# Patient Record
Sex: Male | Born: 1945 | Race: White | Hispanic: No | Marital: Married | Smoking: Former smoker
Health system: Southern US, Community
[De-identification: ages and names within clinical notes are randomized; demographics above are authoritative.]

## PROBLEM LIST (undated history)

## (undated) DIAGNOSIS — M545 Low back pain, unspecified: Secondary | ICD-10-CM

## (undated) DIAGNOSIS — E119 Type 2 diabetes mellitus without complications: Secondary | ICD-10-CM

## (undated) DIAGNOSIS — D3000 Benign neoplasm of unspecified kidney: Secondary | ICD-10-CM

## (undated) DIAGNOSIS — K449 Diaphragmatic hernia without obstruction or gangrene: Secondary | ICD-10-CM

## (undated) DIAGNOSIS — E785 Hyperlipidemia, unspecified: Secondary | ICD-10-CM

## (undated) DIAGNOSIS — K439 Ventral hernia without obstruction or gangrene: Secondary | ICD-10-CM

## (undated) DIAGNOSIS — I700 Atherosclerosis of aorta: Secondary | ICD-10-CM

## (undated) DIAGNOSIS — N529 Male erectile dysfunction, unspecified: Secondary | ICD-10-CM

## (undated) DIAGNOSIS — J449 Chronic obstructive pulmonary disease, unspecified: Secondary | ICD-10-CM

## (undated) DIAGNOSIS — I10 Essential (primary) hypertension: Secondary | ICD-10-CM

## (undated) DIAGNOSIS — N183 Chronic kidney disease, stage 3 unspecified: Secondary | ICD-10-CM

## (undated) DIAGNOSIS — M1990 Unspecified osteoarthritis, unspecified site: Secondary | ICD-10-CM

## (undated) HISTORY — DX: Type 2 diabetes mellitus without complications: E11.9

## (undated) HISTORY — DX: Chronic obstructive pulmonary disease, unspecified: J44.9

## (undated) HISTORY — DX: Atherosclerosis of aorta: I70.0

## (undated) HISTORY — PX: TONSILLECTOMY: SUR1361

## (undated) HISTORY — DX: Diaphragmatic hernia without obstruction or gangrene: K44.9

## (undated) HISTORY — PX: BACK SURGERY: SHX140

## (undated) HISTORY — DX: Chronic kidney disease, stage 3 unspecified: N18.30

## (undated) HISTORY — DX: Male erectile dysfunction, unspecified: N52.9

## (undated) HISTORY — DX: Low back pain, unspecified: M54.50

## (undated) HISTORY — DX: Hyperlipidemia, unspecified: E78.5

## (undated) HISTORY — DX: Essential (primary) hypertension: I10

## (undated) HISTORY — PX: REPLACEMENT TOTAL KNEE: SUR1224

## (undated) HISTORY — PX: MUSCLE REPAIR: SHX867

---
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
## 1972-12-10 HISTORY — PX: INGUINAL HERNIA REPAIR: SHX194

## 1999-05-03 ENCOUNTER — Encounter: Payer: Self-pay | Admitting: Neurosurgery

## 1999-05-03 ENCOUNTER — Ambulatory Visit (HOSPITAL_COMMUNITY): Admission: RE | Admit: 1999-05-03 | Discharge: 1999-05-03 | Payer: Self-pay | Admitting: Neurosurgery

## 2008-01-19 ENCOUNTER — Encounter: Admission: RE | Admit: 2008-01-19 | Discharge: 2008-01-19 | Payer: Self-pay | Admitting: Internal Medicine

## 2008-02-18 ENCOUNTER — Ambulatory Visit (HOSPITAL_BASED_OUTPATIENT_CLINIC_OR_DEPARTMENT_OTHER): Admission: RE | Admit: 2008-02-18 | Discharge: 2008-02-18 | Payer: Self-pay | Admitting: Orthopedic Surgery

## 2008-10-02 ENCOUNTER — Encounter: Admission: RE | Admit: 2008-10-02 | Discharge: 2008-10-02 | Payer: Self-pay | Admitting: Internal Medicine

## 2008-11-16 ENCOUNTER — Encounter: Admission: RE | Admit: 2008-11-16 | Discharge: 2008-11-16 | Payer: Self-pay | Admitting: Neurosurgery

## 2008-12-01 ENCOUNTER — Encounter: Admission: RE | Admit: 2008-12-01 | Discharge: 2008-12-01 | Payer: Self-pay | Admitting: Neurosurgery

## 2009-03-25 ENCOUNTER — Encounter: Admission: RE | Admit: 2009-03-25 | Discharge: 2009-03-25 | Payer: Self-pay | Admitting: Internal Medicine

## 2009-05-03 ENCOUNTER — Encounter: Admission: RE | Admit: 2009-05-03 | Discharge: 2009-05-03 | Payer: Self-pay | Admitting: Neurosurgery

## 2009-12-05 ENCOUNTER — Inpatient Hospital Stay (HOSPITAL_COMMUNITY): Admission: RE | Admit: 2009-12-05 | Discharge: 2009-12-07 | Payer: Self-pay | Admitting: Orthopedic Surgery

## 2011-03-12 LAB — CBC
HCT: 33.7 % — ABNORMAL LOW (ref 39.0–52.0)
Hemoglobin: 11.4 g/dL — ABNORMAL LOW (ref 13.0–17.0)
Hemoglobin: 13.9 g/dL (ref 13.0–17.0)
MCHC: 33.9 g/dL (ref 30.0–36.0)
MCHC: 34 g/dL (ref 30.0–36.0)
MCV: 91.5 fL (ref 78.0–100.0)
MCV: 91.9 fL (ref 78.0–100.0)
Platelets: 204 10*3/uL (ref 150–400)
RBC: 3.68 MIL/uL — ABNORMAL LOW (ref 4.22–5.81)
RBC: 3.79 MIL/uL — ABNORMAL LOW (ref 4.22–5.81)
RBC: 4.51 MIL/uL (ref 4.22–5.81)
RDW: 13.3 % (ref 11.5–15.5)
WBC: 10.2 10*3/uL (ref 4.0–10.5)
WBC: 8.3 10*3/uL (ref 4.0–10.5)

## 2011-03-12 LAB — DIFFERENTIAL
Lymphocytes Relative: 22 % (ref 12–46)
Lymphs Abs: 1.8 10*3/uL (ref 0.7–4.0)
Monocytes Absolute: 0.5 10*3/uL (ref 0.1–1.0)
Monocytes Relative: 6 % (ref 3–12)
Neutro Abs: 5.8 10*3/uL (ref 1.7–7.7)

## 2011-03-12 LAB — TYPE AND SCREEN: Antibody Screen: NEGATIVE

## 2011-03-12 LAB — APTT: aPTT: 27 seconds (ref 24–37)

## 2011-03-12 LAB — BASIC METABOLIC PANEL
CO2: 30 mEq/L (ref 19–32)
Calcium: 9 mg/dL (ref 8.4–10.5)
Chloride: 102 mEq/L (ref 96–112)
Creatinine, Ser: 1.13 mg/dL (ref 0.4–1.5)
GFR calc Af Amer: >60 mL/min (ref >60)
GFR calc Af Amer: >60 mL/min (ref >60)
GFR calc non Af Amer: >60 mL/min (ref >60)
GFR calc non Af Amer: >60 mL/min (ref >60)
Potassium: 4.5 mEq/L (ref 3.5–5.1)
Sodium: 135 mEq/L (ref 135–145)

## 2011-03-12 LAB — GLUCOSE, CAPILLARY
Glucose-Capillary: 152 mg/dL — ABNORMAL HIGH (ref 70–99)
Glucose-Capillary: 153 mg/dL — ABNORMAL HIGH (ref 70–99)
Glucose-Capillary: 164 mg/dL — ABNORMAL HIGH (ref 70–99)
Glucose-Capillary: 168 mg/dL — ABNORMAL HIGH (ref 70–99)
Glucose-Capillary: 188 mg/dL — ABNORMAL HIGH (ref 70–99)
Glucose-Capillary: 190 mg/dL — ABNORMAL HIGH (ref 70–99)

## 2011-03-12 LAB — URINALYSIS, ROUTINE W REFLEX MICROSCOPIC
Nitrite: NEGATIVE
Specific Gravity, Urine: 1.018 (ref 1.005–1.030)
Urobilinogen, UA: 0.2 mg/dL (ref 0.0–1.0)
pH: 7 (ref 5.0–8.0)

## 2011-03-12 LAB — PROTIME-INR: Prothrombin Time: 13.9 seconds (ref 11.6–15.2)

## 2011-04-24 NOTE — Op Note (Signed)
NAME:  ADRIK, PANNO NO.:  [id number]   MEDICAL RECORD NO.:  [id number]          PATIENT TYPE:  AMB   LOCATION:  [facility]                         FACILITY:  [facility]   PHYSICIAN:  Gaynelle Arabian, M.D.    DATE OF BIRTH:  15-Jan-1946   DATE OF PROCEDURE:  02/18/2008  DATE OF DISCHARGE:                               OPERATIVE REPORT   PREOPERATIVE DIAGNOSIS:  Left knee medial meniscal tear.   POSTOPERATIVE DIAGNOSIS:  Left knee medial meniscal tear.   PROCEDURE:  Left knee arthroscopy with meniscal debridement.   SURGEON:  Gaynelle Arabian, M.D.   ASSISTANT:  None.   ANESTHESIA:  General.   ESTIMATED BLOOD LOSS:  Minimal.   DRAINS:  None.   COMPLICATIONS:  Discharged to recovery.   CLINICAL NOTE:  Rick [name] is a 65 year old male with several month  history of significant knee pain and mechanical symptoms. It has gotten  progressively worse over time.  Exam and history suggestive of medial  meniscal tear confirmed by MRI.  He presents now for arthroscopy and  debridement.   PROCEDURE IN DETAIL:  After successful administration of general  anesthetic, a tourniquet was placed high on the left thigh and left  lower extremity prepped and draped in the usual sterile fashion.  Standard superomedial and inferolateral incisions were made.  Inflow  cannula passed superomedial and  camera passed inferolateral.  Arthroscope visualization proceeds.  Undersurface of patella has mild  chondromalacia but no unstable cartilage.  The trochlea looks fairly  normal.  Medial and lateral gutters are visualized.  There are no loose  bodies.  Flexion and valgus force is applied to the knee and the medial  compartment is entered. Upon entering, it is noted that there is a  significant degenerative tear in the body and posterior horn of the  medial meniscus which appears unstable.  There is minimal chondromalacia  in the medial compartment.  A spinal needle was used, 05 standard, for  medial portals.  Small incision made and dilator placed.  We then  debrided the meniscus back to a stable base with baskets and a 4.2 mm  shaver and then sealed it off with the ArthroCare device.  The femoral  condyle looked normal.  The intercondylar notch is then visualized.  The  ACL looks normal.  Lateral compartments entered and it is normal.  I  again inspected the joint and no further tears, loose bodies, or  chondral defects.  The arthroscopic instruments were  removed from the inferior portals which I closed with interrupted 4-0  nylon.  Then 20 mL of 0.25% Marcaine with epi injected through the  inflow cannula, then that is removed and that portal closed with nylon.  A bulky sterile dressing was applied and he was awakened and transported  to recovery in stable condition.      Gaynelle Arabian, M.D.  Electronically Signed     FA/MEDQ  D:  02/18/2008  T:  02/18/2008  Job:  [id number]

## 2011-09-03 LAB — POCT I-STAT, CHEM 8
BUN: 17
BUN: 19
Chloride: 105
Chloride: 107
Creatinine, Ser: 0.9
Hemoglobin: 14.6
Potassium: 4.4
Potassium: 7.2
Sodium: 135
Sodium: 139

## 2013-03-06 ENCOUNTER — Other Ambulatory Visit: Payer: Self-pay | Admitting: Internal Medicine

## 2013-03-06 DIAGNOSIS — Z905 Acquired absence of kidney: Secondary | ICD-10-CM

## 2013-03-23 ENCOUNTER — Ambulatory Visit
Admission: RE | Admit: 2013-03-23 | Discharge: 2013-03-23 | Disposition: A | Discharge status: Home / Self Care | Payer: BC Managed Care – PPO | Source: Ambulatory Visit | Attending: Internal Medicine | Admitting: Internal Medicine

## 2013-03-23 ENCOUNTER — Other Ambulatory Visit: Payer: Self-pay | Admitting: Internal Medicine

## 2013-03-23 DIAGNOSIS — Z905 Acquired absence of kidney: Secondary | ICD-10-CM

## 2013-03-23 MED ORDER — IOHEXOL 300 MG/ML  SOLN
125.0000 mL | Freq: Once | INTRAMUSCULAR | Status: AC | PRN
  Administered 2013-03-23: 125 mL via INTRAVENOUS

## 2013-03-23 MED ORDER — IOHEXOL 300 MG/ML  SOLN
30.0000 mL | Freq: Once | INTRAMUSCULAR | Status: AC | PRN
  Administered 2013-03-23: 30 mL via ORAL

## 2014-03-23 ENCOUNTER — Other Ambulatory Visit: Payer: Self-pay | Admitting: Neurosurgery

## 2014-03-23 DIAGNOSIS — M5136 Other intervertebral disc degeneration, lumbar region: Secondary | ICD-10-CM

## 2014-03-23 DIAGNOSIS — M51369 Other intervertebral disc degeneration, lumbar region without mention of lumbar back pain or lower extremity pain: Secondary | ICD-10-CM

## 2014-03-25 ENCOUNTER — Ambulatory Visit
Admission: RE | Admit: 2014-03-25 | Discharge: 2014-03-25 | Disposition: A | Discharge status: Home / Self Care | Payer: Self-pay | Source: Ambulatory Visit | Attending: Neurosurgery | Admitting: Neurosurgery

## 2014-03-25 VITALS — BP 142/68 | HR 73

## 2014-03-25 DIAGNOSIS — M51369 Other intervertebral disc degeneration, lumbar region without mention of lumbar back pain or lower extremity pain: Secondary | ICD-10-CM

## 2014-03-25 DIAGNOSIS — M5136 Other intervertebral disc degeneration, lumbar region: Secondary | ICD-10-CM

## 2014-03-25 MED ORDER — IOHEXOL 180 MG/ML  SOLN
15.0000 mL | Freq: Once | INTRAMUSCULAR | Status: AC | PRN
  Administered 2014-03-25: 17 mL via INTRATHECAL

## 2014-03-25 MED ORDER — DIAZEPAM 5 MG PO TABS
5.0000 mg | ORAL_TABLET | Freq: Once | ORAL | Status: AC
  Administered 2014-03-25: 5 mg via ORAL

## 2014-03-25 MED ORDER — ONDANSETRON HCL 4 MG/2ML IJ SOLN
4.0000 mg | Freq: Once | INTRAMUSCULAR | Status: AC
Start: 2014-03-25 — End: 2014-03-25
  Administered 2014-03-25: 4 mg via INTRAMUSCULAR

## 2014-03-25 MED ORDER — MEPERIDINE HCL 100 MG/ML IJ SOLN
100.0000 mg | Freq: Once | INTRAMUSCULAR | Status: AC
  Administered 2014-03-25: 100 mg via INTRAMUSCULAR

## 2014-03-25 NOTE — Discharge Instructions (Signed)

## 2014-04-02 ENCOUNTER — Other Ambulatory Visit: Payer: Self-pay | Admitting: Neurosurgery

## 2014-04-02 DIAGNOSIS — M5136 Other intervertebral disc degeneration, lumbar region: Secondary | ICD-10-CM

## 2014-04-13 ENCOUNTER — Other Ambulatory Visit: Payer: Self-pay | Admitting: Neurosurgery

## 2014-04-22 ENCOUNTER — Ambulatory Visit
Admission: RE | Admit: 2014-04-22 | Discharge: 2014-04-22 | Disposition: A | Discharge status: Home / Self Care | Payer: Self-pay | Source: Ambulatory Visit | Attending: Neurosurgery | Admitting: Neurosurgery

## 2014-04-22 DIAGNOSIS — M5136 Other intervertebral disc degeneration, lumbar region: Secondary | ICD-10-CM

## 2014-04-22 MED ORDER — METHYLPREDNISOLONE ACETATE 40 MG/ML INJ SUSP (RADIOLOG
120.0000 mg | Freq: Once | INTRAMUSCULAR | Status: AC
  Administered 2014-04-22: 120 mg via EPIDURAL

## 2014-04-22 MED ORDER — IOHEXOL 180 MG/ML  SOLN
1.0000 mL | Freq: Once | INTRAMUSCULAR | Status: AC | PRN
  Administered 2014-04-22: 1 mL via EPIDURAL

## 2014-04-22 NOTE — Discharge Instructions (Signed)

## 2014-04-23 ENCOUNTER — Other Ambulatory Visit (HOSPITAL_COMMUNITY): Payer: BC Managed Care – PPO

## 2014-06-03 ENCOUNTER — Encounter (HOSPITAL_COMMUNITY): Payer: Self-pay | Admitting: Pharmacy Technician

## 2014-06-07 NOTE — Pre-Procedure Instructions (Signed)
Rick [name]  06/07/2014   Your procedure is scheduled on:  Tuesday, July 7th   Report to [facility] Admitting at 7:00 AM.   Call this number if you have problems the morning of surgery: [phone]   Remember:   Do not eat food or drink liquids after midnight Monday, July 6th.    Take these medicines the morning of surgery with A SIP OF WATER: Norvasc, Hydrocodone   Do not wear jewelry--no rings or watches.  Do not wear lotions or colognes.  You may NOT wear deodorant.   Men may shave face and neck.   Do not bring valuables to the hospital.  [facility] is not responsible for any belongings or valuables.               Contacts, dentures or bridgework may not be worn into surgery.  Leave suitcase in the car. After surgery it may be brought to your room.  For patients admitted to the hospital, discharge time is determined by your treatment team.    Name and phone number of your driver:    Special Instructions: "Preparing for Surgery" instruction sheet.   Please read over the following fact sheets that you were given: Pain Booklet, Blood Transfusion Information, MRSA Information and Surgical Site Infection Prevention

## 2014-06-08 ENCOUNTER — Ambulatory Visit (HOSPITAL_COMMUNITY)
Admission: RE | Admit: 2014-06-08 | Discharge: 2014-06-08 | Disposition: A | Discharge status: Home / Self Care | Payer: MEDICARE | Source: Ambulatory Visit | Attending: Neurosurgery | Admitting: Neurosurgery

## 2014-06-08 ENCOUNTER — Encounter (HOSPITAL_COMMUNITY): Payer: Self-pay

## 2014-06-08 ENCOUNTER — Encounter (HOSPITAL_COMMUNITY)
Admission: RE | Admit: 2014-06-08 | Discharge: 2014-06-08 | Disposition: A | Discharge status: Home / Self Care | Payer: MEDICARE | Source: Ambulatory Visit | Attending: Neurosurgery | Admitting: Neurosurgery

## 2014-06-08 DIAGNOSIS — I10 Essential (primary) hypertension: Secondary | ICD-10-CM | POA: Insufficient documentation

## 2014-06-08 DIAGNOSIS — Z01818 Encounter for other preprocedural examination: Secondary | ICD-10-CM | POA: Insufficient documentation

## 2014-06-08 DIAGNOSIS — E119 Type 2 diabetes mellitus without complications: Secondary | ICD-10-CM | POA: Insufficient documentation

## 2014-06-08 HISTORY — DX: Ventral hernia without obstruction or gangrene: K43.9

## 2014-06-08 HISTORY — DX: Benign neoplasm of unspecified kidney: D30.00

## 2014-06-08 HISTORY — DX: Unspecified osteoarthritis, unspecified site: M19.90

## 2014-06-08 LAB — COMPREHENSIVE METABOLIC PANEL
ALK PHOS: 45 U/L (ref 39–117)
ALT: 87 U/L — AB (ref 0–53)
AST: 44 U/L — AB (ref 0–37)
Albumin: 3.6 g/dL (ref 3.5–5.2)
BUN: 14 mg/dL (ref 6–23)
CALCIUM: 9.4 mg/dL (ref 8.4–10.5)
CO2: 26 meq/L (ref 19–32)
Chloride: 100 mEq/L (ref 96–112)
Creatinine, Ser: 1.29 mg/dL (ref 0.50–1.35)
GFR calc non Af Amer: 56 mL/min — ABNORMAL LOW (ref >90)
GFR, EST AFRICAN AMERICAN: 65 mL/min — AB (ref >90)
GLUCOSE: 116 mg/dL — AB (ref 70–99)
POTASSIUM: 4.5 meq/L (ref 3.7–5.3)
SODIUM: 139 meq/L (ref 137–147)
Total Bilirubin: 1 mg/dL (ref 0.3–1.2)
Total Protein: 7.1 g/dL (ref 6.0–8.3)

## 2014-06-08 LAB — TYPE AND SCREEN
ABO/RH(D): A POS
Antibody Screen: NEGATIVE

## 2014-06-08 LAB — CBC
HCT: 40.8 % (ref 39.0–52.0)
HEMOGLOBIN: 12.9 g/dL — AB (ref 13.0–17.0)
MCH: 29.6 pg (ref 26.0–34.0)
MCHC: 31.6 g/dL (ref 30.0–36.0)
MCV: 93.6 fL (ref 78.0–100.0)
PLATELETS: 208 10*3/uL (ref 150–400)
RBC: 4.36 MIL/uL (ref 4.22–5.81)
RDW: 13.6 % (ref 11.5–15.5)
WBC: 7.1 10*3/uL (ref 4.0–10.5)

## 2014-06-08 LAB — SURGICAL PCR SCREEN
MRSA, PCR: NEGATIVE
Staphylococcus aureus: NEGATIVE

## 2014-06-08 NOTE — Progress Notes (Signed)
Has no PCP  -- "fired mine" But he does see Dr. Elyse Hsu for his sugar . Patient had kidney removed d/t tumor--had some post op complications..requesting records from [facility].

## 2014-06-08 NOTE — Progress Notes (Signed)
06/08/14 1316  OBSTRUCTIVE SLEEP APNEA  Have you ever been diagnosed with sleep apnea through a sleep study? No  Do you snore loudly (loud enough to be heard through closed doors)?  0  Do you often feel tired, fatigued, or sleepy during the daytime? 0  Has anyone observed you stop breathing during your sleep? 0  Do you have, or are you being treated for high blood pressure? 1  BMI more than 35 kg/m2? 0  Age over 67 years old? 1  Neck circumference greater than 40 cm/16 inches? 1  Gender: 1  Obstructive Sleep Apnea Score 4  Score 4 or greater  Results sent to PCP

## 2014-06-14 MED ORDER — CEFAZOLIN SODIUM-DEXTROSE 2-3 GM-% IV SOLR
2.0000 g | INTRAVENOUS | Status: AC
  Administered 2014-06-15: 2 g via INTRAVENOUS

## 2014-06-14 NOTE — H&P (Signed)
THESEUS [name] is an 68 y.o. male.   Chief Complaint: lumbar pain [id number] complaining of lumbar pain for almost 5 years not getting better with conservative treament including epidural. 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 Hypertension   . Diabetes mellitus without complication     type 2  . High cholesterol   . Kidney tumor (benign)     over @ [facility]  . Ventral hernia   . Lateral ventral hernia     left side  . H/O hiatal hernia   . Arthritis     Past Surgical History  Procedure Laterality Date  . Joint replacement      left knee  . Muscle repair      right shoulder by Dr. Mayer Camel   . Tonsillectomy    . Hernia repair      inguinal 1974    No family history on file. Social History:  reports that he quit smoking about 15 years ago. His smoking use included Cigarettes. He has a 45 pack-year smoking history. He does not have any smokeless tobacco history on file. He reports that he drinks about 14.4 ounces of alcohol per week. He reports that he does not use illicit drugs.  Allergies:  Allergies  Allergen Reactions  . Dilaudid [Hydromorphone Hcl] Nausea And Vomiting    No prescriptions prior to admission    No results found for this or any previous visit (from the past 48 hour(s)). No results found.  Review of Systems  Constitutional: Negative.   HENT: Negative.   Eyes: Negative.   Respiratory: Negative.   Cardiovascular: Negative.        Arterial hypertension  Gastrointestinal: Negative.   Genitourinary: Negative.   Musculoskeletal: Positive for back pain.  Skin: Negative.   Neurological: Positive for sensory change and focal weakness.  Endo/Heme/Allergies:       DM  Psychiatric/Behavioral: Negative.     There were no vitals taken for this visit. Physical Exam hent, nl. Neck, nl. Cv, nl. Lungs, clear.abdomen, soft. Extremituies, scar from previous  surgery. NEURO strength, nl SLR positive at 60 degrees bilaterally. Myelo shows 3 level ddd with spondylolisthesis from l3 to s1 Assessment/Plan Decompression and fusion from l3 to s1 . He is aware of risks and benefits  Keirstan Iannello M 06/14/2014, 5:07 PM

## 2014-06-15 ENCOUNTER — Inpatient Hospital Stay (HOSPITAL_COMMUNITY)
Admission: RE | Admit: 2014-06-15 | Discharge: 2014-06-21 | DRG: 460 | Disposition: A | Discharge status: Home / Self Care | Payer: MEDICARE | Source: Ambulatory Visit | Attending: Neurosurgery | Admitting: Neurosurgery

## 2014-06-15 ENCOUNTER — Inpatient Hospital Stay (HOSPITAL_COMMUNITY): Payer: MEDICARE

## 2014-06-15 ENCOUNTER — Encounter (HOSPITAL_COMMUNITY): Payer: MEDICARE | Admitting: Anesthesiology

## 2014-06-15 ENCOUNTER — Inpatient Hospital Stay (HOSPITAL_COMMUNITY): Payer: MEDICARE | Admitting: Anesthesiology

## 2014-06-15 ENCOUNTER — Encounter (HOSPITAL_COMMUNITY): Payer: Self-pay | Admitting: Anesthesiology

## 2014-06-15 ENCOUNTER — Encounter (HOSPITAL_COMMUNITY)
Admission: RE | Disposition: A | Discharge status: Home / Self Care | Payer: MEDICARE | Source: Ambulatory Visit | Attending: Neurosurgery

## 2014-06-15 DIAGNOSIS — Z87891 Personal history of nicotine dependence: Secondary | ICD-10-CM

## 2014-06-15 DIAGNOSIS — Z79899 Other long term (current) drug therapy: Secondary | ICD-10-CM

## 2014-06-15 DIAGNOSIS — M5137 Other intervertebral disc degeneration, lumbosacral region: Secondary | ICD-10-CM | POA: Diagnosis not present

## 2014-06-15 DIAGNOSIS — M51379 Other intervertebral disc degeneration, lumbosacral region without mention of lumbar back pain or lower extremity pain: Secondary | ICD-10-CM | POA: Diagnosis present

## 2014-06-15 DIAGNOSIS — Z96659 Presence of unspecified artificial knee joint: Secondary | ICD-10-CM

## 2014-06-15 DIAGNOSIS — I10 Essential (primary) hypertension: Secondary | ICD-10-CM | POA: Diagnosis not present

## 2014-06-15 DIAGNOSIS — E7800 Pure hypercholesterolemia, unspecified: Secondary | ICD-10-CM | POA: Diagnosis present

## 2014-06-15 DIAGNOSIS — E119 Type 2 diabetes mellitus without complications: Secondary | ICD-10-CM | POA: Diagnosis present

## 2014-06-15 DIAGNOSIS — Z885 Allergy status to narcotic agent status: Secondary | ICD-10-CM

## 2014-06-15 DIAGNOSIS — M4316 Spondylolisthesis, lumbar region: Secondary | ICD-10-CM | POA: Diagnosis present

## 2014-06-15 DIAGNOSIS — Q762 Congenital spondylolisthesis: Principal | ICD-10-CM

## 2014-06-15 LAB — BASIC METABOLIC PANEL
Anion gap: 12 (ref 5–15)
BUN: 17 mg/dL (ref 6–23)
CHLORIDE: 103 meq/L (ref 96–112)
CO2: 24 meq/L (ref 19–32)
Calcium: 8.3 mg/dL — ABNORMAL LOW (ref 8.4–10.5)
Creatinine, Ser: 1.25 mg/dL (ref 0.50–1.35)
GFR calc non Af Amer: 58 mL/min — ABNORMAL LOW (ref >90)
GFR, EST AFRICAN AMERICAN: 68 mL/min — AB (ref >90)
Glucose, Bld: 195 mg/dL — ABNORMAL HIGH (ref 70–99)
POTASSIUM: 5 meq/L (ref 3.7–5.3)
Sodium: 139 mEq/L (ref 137–147)

## 2014-06-15 LAB — GLUCOSE, CAPILLARY
GLUCOSE-CAPILLARY: 152 mg/dL — AB (ref 70–99)
Glucose-Capillary: 162 mg/dL — ABNORMAL HIGH (ref 70–99)
Glucose-Capillary: 166 mg/dL — ABNORMAL HIGH (ref 70–99)

## 2014-06-15 LAB — CBC
HEMATOCRIT: 33.6 % — AB (ref 39.0–52.0)
Hemoglobin: 10.7 g/dL — ABNORMAL LOW (ref 13.0–17.0)
MCH: 30.1 pg (ref 26.0–34.0)
MCHC: 31.8 g/dL (ref 30.0–36.0)
MCV: 94.4 fL (ref 78.0–100.0)
Platelets: 194 10*3/uL (ref 150–400)
RBC: 3.56 MIL/uL — AB (ref 4.22–5.81)
RDW: 13.5 % (ref 11.5–15.5)
WBC: 13.1 10*3/uL — AB (ref 4.0–10.5)

## 2014-06-15 SURGERY — POSTERIOR LUMBAR FUSION 3 LEVEL
Anesthesia: General

## 2014-06-15 MED ORDER — LACTATED RINGERS IV SOLN
INTRAVENOUS | Status: DC | PRN
  Administered 2014-06-15 (×3): via INTRAVENOUS

## 2014-06-15 MED ORDER — PROPOFOL 10 MG/ML IV BOLUS
INTRAVENOUS | Status: AC
  Filled 2014-06-15: qty 20

## 2014-06-15 MED ORDER — THROMBIN 20000 UNITS EX SOLR
CUTANEOUS | Status: DC | PRN
  Administered 2014-06-15 (×2): via TOPICAL

## 2014-06-15 MED ORDER — HYDROMORPHONE HCL PF 1 MG/ML IJ SOLN: 0.2500 mg | INTRAMUSCULAR | Status: DC | PRN

## 2014-06-15 MED ORDER — PHENOL 1.4 % MT LIQD: 1.0000 | OROMUCOSAL | Status: DC | PRN

## 2014-06-15 MED ORDER — DIPHENHYDRAMINE HCL 50 MG/ML IJ SOLN
12.5000 mg | Freq: Four times a day (QID) | INTRAMUSCULAR | Status: DC | PRN
  Filled 2014-06-15: qty 0.25

## 2014-06-15 MED ORDER — ACETAMINOPHEN 325 MG PO TABS
650.0000 mg | ORAL_TABLET | ORAL | Status: DC | PRN
  Administered 2014-06-16 – 2014-06-17 (×2): 650 mg via ORAL
  Filled 2014-06-15 (×2): qty 2

## 2014-06-15 MED ORDER — ONDANSETRON HCL 4 MG/2ML IJ SOLN: 4.0000 mg | Freq: Once | INTRAMUSCULAR | Status: DC | PRN

## 2014-06-15 MED ORDER — MORPHINE SULFATE (PF) 1 MG/ML IV SOLN
INTRAVENOUS | Status: DC
  Administered 2014-06-15: 16:00:00 via INTRAVENOUS

## 2014-06-15 MED ORDER — NEOSTIGMINE METHYLSULFATE 10 MG/10ML IV SOLN
INTRAVENOUS | Status: DC | PRN
  Administered 2014-06-15: 4 mg via INTRAVENOUS

## 2014-06-15 MED ORDER — NALOXONE HCL 0.4 MG/ML IJ SOLN: 0.4000 mg | INTRAMUSCULAR | Status: DC | PRN

## 2014-06-15 MED ORDER — FENTANYL CITRATE 0.05 MG/ML IJ SOLN
INTRAMUSCULAR | Status: AC
  Filled 2014-06-15: qty 2

## 2014-06-15 MED ORDER — SODIUM CHLORIDE 0.9 % IJ SOLN: 3.0000 mL | INTRAMUSCULAR | Status: DC | PRN

## 2014-06-15 MED ORDER — ROCURONIUM BROMIDE 50 MG/5ML IV SOLN
INTRAVENOUS | Status: AC
  Filled 2014-06-15: qty 1

## 2014-06-15 MED ORDER — DIPHENHYDRAMINE HCL 12.5 MG/5ML PO ELIX: 12.5000 mg | ORAL_SOLUTION | Freq: Four times a day (QID) | ORAL | Status: DC | PRN

## 2014-06-15 MED ORDER — DIPHENHYDRAMINE HCL 50 MG/ML IJ SOLN: 12.5000 mg | Freq: Four times a day (QID) | INTRAMUSCULAR | Status: DC | PRN

## 2014-06-15 MED ORDER — FENTANYL CITRATE 0.05 MG/ML IJ SOLN
INTRAMUSCULAR | Status: AC
  Filled 2014-06-15: qty 5

## 2014-06-15 MED ORDER — SODIUM CHLORIDE 0.9 % IJ SOLN: 9.0000 mL | INTRAMUSCULAR | Status: DC | PRN

## 2014-06-15 MED ORDER — BUPIVACAINE LIPOSOME 1.3 % IJ SUSP
20.0000 mL | Freq: Once | INTRAMUSCULAR | Status: DC
  Filled 2014-06-15: qty 20

## 2014-06-15 MED ORDER — LIDOCAINE HCL 4 % MT SOLN
OROMUCOSAL | Status: DC | PRN
  Administered 2014-06-15: 4 mL via TOPICAL

## 2014-06-15 MED ORDER — FENTANYL CITRATE 0.05 MG/ML IJ SOLN
25.0000 ug | INTRAMUSCULAR | Status: DC | PRN
  Administered 2014-06-15 (×4): 50 ug via INTRAVENOUS

## 2014-06-15 MED ORDER — PROPOFOL 10 MG/ML IV BOLUS
INTRAVENOUS | Status: DC | PRN
  Administered 2014-06-15: 200 mg via INTRAVENOUS

## 2014-06-15 MED ORDER — ACETAMINOPHEN 650 MG RE SUPP: 650.0000 mg | RECTAL | Status: DC | PRN

## 2014-06-15 MED ORDER — MORPHINE SULFATE (PF) 1 MG/ML IV SOLN
INTRAVENOUS | Status: DC
  Administered 2014-06-15: 9 mg via INTRAVENOUS
  Administered 2014-06-16: 3.49 mg via INTRAVENOUS
  Administered 2014-06-16: 1.5 mg via INTRAVENOUS
  Administered 2014-06-16: 6 mg via INTRAVENOUS
  Administered 2014-06-16: 3 mg via INTRAVENOUS
  Administered 2014-06-16: 8.1 mg via INTRAVENOUS
  Administered 2014-06-17: 3 mg via INTRAVENOUS
  Administered 2014-06-17: 4.5 mg via INTRAVENOUS
  Filled 2014-06-15: qty 25

## 2014-06-15 MED ORDER — ONDANSETRON HCL 4 MG/2ML IJ SOLN: 4.0000 mg | Freq: Four times a day (QID) | INTRAMUSCULAR | Status: DC | PRN

## 2014-06-15 MED ORDER — ONDANSETRON HCL 4 MG/2ML IJ SOLN
4.0000 mg | INTRAMUSCULAR | Status: DC | PRN
  Administered 2014-06-17 – 2014-06-19 (×5): 4 mg via INTRAVENOUS
  Filled 2014-06-15 (×5): qty 2

## 2014-06-15 MED ORDER — 0.9 % SODIUM CHLORIDE (POUR BTL) OPTIME
TOPICAL | Status: DC | PRN
  Administered 2014-06-15: 1000 mL

## 2014-06-15 MED ORDER — PHENYLEPHRINE HCL 10 MG/ML IJ SOLN
INTRAMUSCULAR | Status: DC | PRN
  Administered 2014-06-15: 80 ug via INTRAVENOUS

## 2014-06-15 MED ORDER — ALBUMIN HUMAN 5 % IV SOLN
INTRAVENOUS | Status: DC | PRN
  Administered 2014-06-15 (×2): via INTRAVENOUS

## 2014-06-15 MED ORDER — DIAZEPAM 5 MG PO TABS
ORAL_TABLET | ORAL | Status: AC
Start: 2014-06-15 — End: 2014-06-16
  Filled 2014-06-15: qty 2

## 2014-06-15 MED ORDER — CEFAZOLIN SODIUM-DEXTROSE 2-3 GM-% IV SOLR
INTRAVENOUS | Status: AC
  Filled 2014-06-15: qty 50

## 2014-06-15 MED ORDER — EPHEDRINE SULFATE 50 MG/ML IJ SOLN
INTRAMUSCULAR | Status: AC
  Filled 2014-06-15: qty 1

## 2014-06-15 MED ORDER — MIDAZOLAM HCL 2 MG/2ML IJ SOLN
INTRAMUSCULAR | Status: AC
  Filled 2014-06-15: qty 2

## 2014-06-15 MED ORDER — ONDANSETRON HCL 4 MG/2ML IJ SOLN
INTRAMUSCULAR | Status: DC | PRN
  Administered 2014-06-15: 4 mg via INTRAVENOUS

## 2014-06-15 MED ORDER — NALOXONE HCL 0.4 MG/ML IJ SOLN
0.4000 mg | INTRAMUSCULAR | Status: DC | PRN
  Filled 2014-06-15: qty 1

## 2014-06-15 MED ORDER — PHENYLEPHRINE HCL 10 MG/ML IJ SOLN
10.0000 mg | INTRAVENOUS | Status: DC | PRN
  Administered 2014-06-15: 20 ug/min via INTRAVENOUS

## 2014-06-15 MED ORDER — CEFAZOLIN SODIUM 1-5 GM-% IV SOLN
1.0000 g | Freq: Three times a day (TID) | INTRAVENOUS | Status: AC
  Administered 2014-06-15 – 2014-06-16 (×2): 1 g via INTRAVENOUS
  Filled 2014-06-15 (×3): qty 50

## 2014-06-15 MED ORDER — OXYCODONE-ACETAMINOPHEN 5-325 MG PO TABS
1.0000 | ORAL_TABLET | ORAL | Status: DC | PRN
  Administered 2014-06-15: 2 via ORAL

## 2014-06-15 MED ORDER — LACTATED RINGERS IV SOLN
INTRAVENOUS | Status: DC
  Administered 2014-06-15: 07:00:00 via INTRAVENOUS

## 2014-06-15 MED ORDER — DIAZEPAM 5 MG PO TABS
10.0000 mg | ORAL_TABLET | Freq: Four times a day (QID) | ORAL | Status: DC | PRN
  Administered 2014-06-15 – 2014-06-20 (×6): 10 mg via ORAL
  Filled 2014-06-15 (×6): qty 2

## 2014-06-15 MED ORDER — FENTANYL CITRATE 0.05 MG/ML IJ SOLN: 25.0000 ug | INTRAMUSCULAR | Status: DC | PRN

## 2014-06-15 MED ORDER — ROCURONIUM BROMIDE 100 MG/10ML IV SOLN
INTRAVENOUS | Status: DC | PRN
  Administered 2014-06-15: 10 mg via INTRAVENOUS
  Administered 2014-06-15: 20 mg via INTRAVENOUS
  Administered 2014-06-15: 50 mg via INTRAVENOUS
  Administered 2014-06-15 (×2): 10 mg via INTRAVENOUS

## 2014-06-15 MED ORDER — ONDANSETRON HCL 4 MG/2ML IJ SOLN
4.0000 mg | Freq: Four times a day (QID) | INTRAMUSCULAR | Status: DC | PRN
  Filled 2014-06-15: qty 2

## 2014-06-15 MED ORDER — SUCCINYLCHOLINE CHLORIDE 20 MG/ML IJ SOLN
INTRAMUSCULAR | Status: AC
  Filled 2014-06-15: qty 1

## 2014-06-15 MED ORDER — OXYCODONE-ACETAMINOPHEN 5-325 MG PO TABS
ORAL_TABLET | ORAL | Status: AC
  Filled 2014-06-15: qty 2

## 2014-06-15 MED ORDER — DIPHENHYDRAMINE HCL 12.5 MG/5ML PO ELIX
12.5000 mg | ORAL_SOLUTION | Freq: Four times a day (QID) | ORAL | Status: DC | PRN
  Filled 2014-06-15: qty 5

## 2014-06-15 MED ORDER — PHENYLEPHRINE 40 MCG/ML (10ML) SYRINGE FOR IV PUSH (FOR BLOOD PRESSURE SUPPORT)
PREFILLED_SYRINGE | INTRAVENOUS | Status: AC
  Filled 2014-06-15: qty 10

## 2014-06-15 MED ORDER — LIDOCAINE HCL (CARDIAC) 20 MG/ML IV SOLN
INTRAVENOUS | Status: DC | PRN
  Administered 2014-06-15: 60 mg via INTRAVENOUS

## 2014-06-15 MED ORDER — GLYCOPYRROLATE 0.2 MG/ML IJ SOLN
INTRAMUSCULAR | Status: DC | PRN
  Administered 2014-06-15: 0.6 mg via INTRAVENOUS

## 2014-06-15 MED ORDER — SODIUM CHLORIDE 0.9 % IJ SOLN: 3.0000 mL | Freq: Two times a day (BID) | INTRAMUSCULAR | Status: DC

## 2014-06-15 MED ORDER — LIDOCAINE HCL (CARDIAC) 20 MG/ML IV SOLN
INTRAVENOUS | Status: AC
  Filled 2014-06-15: qty 10

## 2014-06-15 MED ORDER — SODIUM CHLORIDE 0.9 % IV SOLN: 250.0000 mL | INTRAVENOUS | Status: DC

## 2014-06-15 MED ORDER — SODIUM CHLORIDE 0.9 % IV SOLN
INTRAVENOUS | Status: DC
  Administered 2014-06-15 – 2014-06-17 (×2): via INTRAVENOUS

## 2014-06-15 MED ORDER — FENTANYL 10 MCG/ML IV SOLN
INTRAVENOUS | Status: DC
  Administered 2014-06-15: 15:00:00 via INTRAVENOUS
  Filled 2014-06-15: qty 50

## 2014-06-15 MED ORDER — MENTHOL 3 MG MT LOZG
1.0000 | LOZENGE | OROMUCOSAL | Status: DC | PRN
  Filled 2014-06-15: qty 9

## 2014-06-15 MED ORDER — MORPHINE SULFATE (PF) 1 MG/ML IV SOLN
INTRAVENOUS | Status: AC
  Filled 2014-06-15: qty 25

## 2014-06-15 MED ORDER — FENTANYL CITRATE 0.05 MG/ML IJ SOLN
INTRAMUSCULAR | Status: DC | PRN
  Administered 2014-06-15 (×5): 50 ug via INTRAVENOUS
  Administered 2014-06-15: 150 ug via INTRAVENOUS
  Administered 2014-06-15: 50 ug via INTRAVENOUS
  Administered 2014-06-15: 25 ug via INTRAVENOUS
  Administered 2014-06-15: 50 ug via INTRAVENOUS
  Administered 2014-06-15: 25 ug via INTRAVENOUS

## 2014-06-15 MED ORDER — BUPIVACAINE LIPOSOME 1.3 % IJ SUSP
INTRAMUSCULAR | Status: DC | PRN
  Administered 2014-06-15: 20 mL

## 2014-06-15 MED ORDER — SUCCINYLCHOLINE CHLORIDE 20 MG/ML IJ SOLN
INTRAMUSCULAR | Status: DC | PRN
  Administered 2014-06-15: 100 mg via INTRAVENOUS

## 2014-06-15 MED FILL — Sodium Chloride Irrigation Soln 0.9%: Qty: 3000 | Status: AC

## 2014-06-15 MED FILL — Heparin Sodium (Porcine) Inj 1000 Unit/ML: INTRAMUSCULAR | Qty: 30 | Status: AC

## 2014-06-15 MED FILL — Sodium Chloride IV Soln 0.9%: INTRAVENOUS | Qty: 1000 | Status: AC

## 2014-06-15 SURGICAL SUPPLY — 75 items
APL SKNCLS STERI-STRIP NONHPOA (GAUZE/BANDAGES/DRESSINGS) ×1
BENZOIN TINCTURE PRP APPL 2/3 (GAUZE/BANDAGES/DRESSINGS) ×2 IMPLANT
BLADE 10 SAFETY STRL DISP (BLADE) ×2 IMPLANT
BLADE SURG ROTATE 9660 (MISCELLANEOUS) IMPLANT
BUR ACORN 6.0 (BURR) ×2 IMPLANT
BUR MATCHSTICK NEURO 3.0 LAGG (BURR) ×2 IMPLANT
CAGE RISE 11-17-15 10X22 (Cage) ×2 IMPLANT
CANISTER SUCT 3000ML (MISCELLANEOUS) ×2 IMPLANT
CAP LOCKING THREADED (Cap) ×8 IMPLANT
CONT SPEC 4OZ CLIKSEAL STRL BL (MISCELLANEOUS) ×2 IMPLANT
COVER BACK TABLE 24X17X13 BIG (DRAPES) IMPLANT
COVER TABLE BACK 60X90 (DRAPES) ×2 IMPLANT
DRAPE C-ARM 42X72 X-RAY (DRAPES) ×4 IMPLANT
DRAPE LAPAROTOMY 100X72X124 (DRAPES) ×2 IMPLANT
DRAPE POUCH INSTRU U-SHP 10X18 (DRAPES) ×2 IMPLANT
DURAPREP 26ML APPLICATOR (WOUND CARE) ×2 IMPLANT
ELECT REM PT RETURN 9FT ADLT (ELECTROSURGICAL) ×2
ELECTRODE REM PT RTRN 9FT ADLT (ELECTROSURGICAL) ×1 IMPLANT
EVACUATOR 1/8 PVC DRAIN (DRAIN) IMPLANT
EVACUATOR 3/16  PVC DRAIN (DRAIN) ×1
EVACUATOR 3/16 PVC DRAIN (DRAIN) IMPLANT
GAUZE SPONGE 4X4 16PLY XRAY LF (GAUZE/BANDAGES/DRESSINGS) ×4 IMPLANT
GLOVE BIOGEL M 8.0 STRL (GLOVE) ×2 IMPLANT
GLOVE BIOGEL PI IND STRL 7.5 (GLOVE) IMPLANT
GLOVE BIOGEL PI INDICATOR 7.5 (GLOVE) ×3
GLOVE ECLIPSE 8.5 STRL (GLOVE) ×1 IMPLANT
GLOVE EXAM NITRILE LRG STRL (GLOVE) IMPLANT
GLOVE EXAM NITRILE MD LF STRL (GLOVE) IMPLANT
GLOVE EXAM NITRILE XL STR (GLOVE) IMPLANT
GLOVE EXAM NITRILE XS STR PU (GLOVE) IMPLANT
GLOVE SURG SS PI 7.0 STRL IVOR (GLOVE) ×5 IMPLANT
GOWN STRL REUS W/ TWL LRG LVL3 (GOWN DISPOSABLE) ×1 IMPLANT
GOWN STRL REUS W/ TWL XL LVL3 (GOWN DISPOSABLE) ×1 IMPLANT
GOWN STRL REUS W/TWL 2XL LVL3 (GOWN DISPOSABLE) IMPLANT
GOWN STRL REUS W/TWL LRG LVL3 (GOWN DISPOSABLE) ×4
GOWN STRL REUS W/TWL XL LVL3 (GOWN DISPOSABLE) ×10
IMPLANT RISE 11X17-8MM SPINE (Neuro Prosthesis/Implant) ×2 IMPLANT
KIT BASIN OR (CUSTOM PROCEDURE TRAY) ×2 IMPLANT
KIT INFUSE LRG II (Orthopedic Implant) ×1 IMPLANT
KIT ROOM TURNOVER OR (KITS) ×2 IMPLANT
NDL HYPO 18GX1.5 BLUNT FILL (NEEDLE) IMPLANT
NDL HYPO 21X1.5 SAFETY (NEEDLE) IMPLANT
NDL HYPO 25X1 1.5 SAFETY (NEEDLE) IMPLANT
NEEDLE HYPO 18GX1.5 BLUNT FILL (NEEDLE) IMPLANT
NEEDLE HYPO 21X1.5 SAFETY (NEEDLE) ×2 IMPLANT
NEEDLE HYPO 25X1 1.5 SAFETY (NEEDLE) IMPLANT
NS IRRIG 1000ML POUR BTL (IV SOLUTION) ×2 IMPLANT
PACK LAMINECTOMY NEURO (CUSTOM PROCEDURE TRAY) ×2 IMPLANT
PAD ABD 8X10 STRL (GAUZE/BANDAGES/DRESSINGS) IMPLANT
PAD ARMBOARD 7.5X6 YLW CONV (MISCELLANEOUS) ×6 IMPLANT
PATTIES SURGICAL .5 X1 (DISPOSABLE) ×2 IMPLANT
PATTIES SURGICAL .5 X3 (DISPOSABLE) IMPLANT
PATTIES SURGICAL 1X1 (DISPOSABLE) ×1 IMPLANT
ROD CREO 100MM SPINAL (Rod) ×2 IMPLANT
SCREW AMP MODULAR CREO 6.5X45 (Screw) ×6 IMPLANT
SCREW PA THRD CREO TULIP 5.5X4 (Head) ×8 IMPLANT
SHAFT CREO AMP 6.5X55 (Neuro Prosthesis/Implant) ×2 IMPLANT
SPACER CROSSLINK 58-70 (Spacer) ×1 IMPLANT
SPONGE GAUZE 4X4 12PLY (GAUZE/BANDAGES/DRESSINGS) ×2 IMPLANT
SPONGE LAP 4X18 X RAY DECT (DISPOSABLE) ×4 IMPLANT
SPONGE NEURO XRAY DETECT 1X3 (DISPOSABLE) IMPLANT
SPONGE SURGIFOAM ABS GEL 100 (HEMOSTASIS) ×2 IMPLANT
STRIP CLOSURE SKIN 1/2X4 (GAUZE/BANDAGES/DRESSINGS) ×2 IMPLANT
SUT VIC AB 1 CT1 18XBRD ANBCTR (SUTURE) ×2 IMPLANT
SUT VIC AB 1 CT1 8-18 (SUTURE) ×4
SUT VIC AB 2-0 CP2 18 (SUTURE) ×4 IMPLANT
SUT VIC AB 3-0 SH 8-18 (SUTURE) ×4 IMPLANT
SYR 20CC LL (SYRINGE) ×1 IMPLANT
SYR 20ML ECCENTRIC (SYRINGE) ×2 IMPLANT
SYR 5ML LL (SYRINGE) IMPLANT
TOWEL OR 17X24 6PK STRL BLUE (TOWEL DISPOSABLE) ×2 IMPLANT
TOWEL OR 17X26 10 PK STRL BLUE (TOWEL DISPOSABLE) ×2 IMPLANT
TRAY FOLEY CATH 14FRSI W/METER (CATHETERS) ×1 IMPLANT
TRAY FOLEY CATH 16FRSI W/METER (SET/KITS/TRAYS/PACK) ×1 IMPLANT
WATER STERILE IRR 1000ML POUR (IV SOLUTION) ×2 IMPLANT

## 2014-06-15 NOTE — Progress Notes (Signed)
Pt. Much more relaxed and stating pain is easing off, states numbness is just in his foot at this time

## 2014-06-15 NOTE — Progress Notes (Signed)
Dr. Joya Salm in [place], will have see pt when out for numbness in left foot and leg, pt. Able to move leg off bed and has full sensation, also arguing about post op pain meds orderd from MD

## 2014-06-15 NOTE — Transfer of Care (Signed)
Immediate Anesthesia Transfer of Care Note  Patient: Rick [name]  Procedure(s) Performed: Procedure(s): POSTERIOR LUMBAR FUSION 3 LEVEL. Lumbar three/four, four/five, five sacral one. (N/A)  Patient Location: PACU  Anesthesia Type:General  Level of Consciousness: awake, alert  and sedated  Airway & Oxygen Therapy: Patient connected to face mask oxygen  Post-op Assessment: Report given to PACU RN  Post vital signs: stable  Complications: No apparent anesthesia complications

## 2014-06-15 NOTE — Anesthesia Preprocedure Evaluation (Addendum)
Anesthesia Evaluation  Patient identified by MRN, date of birth, ID band Patient awake    Reviewed: Allergy & Precautions, H&P , NPO status , Patient's Chart, lab work & pertinent test results, reviewed documented beta blocker date and time   Airway  TM Distance: >3 FB     Dental  (+) Dental Advisory Given, Missing, Partial Upper,    Pulmonary former smoker,  breath sounds clear to auscultation        Cardiovascular hypertension, Pt. on medications Rhythm:Regular     Neuro/Psych    GI/Hepatic hiatal hernia,   Endo/Other  diabetes, Well Controlled, Type 2, Oral Hypoglycemic Agents  Renal/GU Renal disease     Musculoskeletal   Abdominal (+)  Abdomen: soft. Bowel sounds: normal.  Peds  Hematology   Anesthesia Other Findings   Reproductive/Obstetrics                      Anesthesia Physical Anesthesia Plan  ASA: III  Anesthesia Plan: General   Post-op Pain Management:    Induction: Intravenous  Airway Management Planned: Oral ETT  Additional Equipment:   Intra-op Plan:   Post-operative Plan: Extubation in OR  Informed Consent: I have reviewed the patients History and Physical, chart, labs and discussed the procedure including the risks, benefits and alternatives for the proposed anesthesia with the patient or authorized representative who has indicated his/her understanding and acceptance.     Plan Discussed with:   Anesthesia Plan Comments:         Anesthesia Quick Evaluation

## 2014-06-15 NOTE — Anesthesia Procedure Notes (Addendum)
Procedure Name: Intubation Date/Time: 06/15/2014 9:13 AM Performed by: Maeola Harman Pre-anesthesia Checklist: Patient identified, Emergency Drugs available, Suction available, Patient being monitored and Timeout performed Patient Re-evaluated:Patient Re-evaluated prior to inductionOxygen Delivery Method: Circle system utilized Preoxygenation: Pre-oxygenation with 100% oxygen Intubation Type: IV induction Ventilation: Mask ventilation without difficulty Laryngoscope Size: 4 and Mac Grade View: Grade I Tube type: Oral Tube size: 7.5 mm Number of attempts: 1 Airway Equipment and Method: Stylet Placement Confirmation: ETT inserted through vocal cords under direct vision,  positive ETCO2 and breath sounds checked- equal and bilateral Secured at: 23 cm Tube secured with: Tape Dental Injury: Teeth and Oropharynx as per pre-operative assessment  Comments: Easy atraumatic induction and intubation with MAC 4 blade.  Dr. Tamala Julian verified placement and taped ETT.  Waldron Session, CRNA

## 2014-06-15 NOTE — Progress Notes (Signed)
Pt. Very agitated, asking for Morphine,states pain is a 10, Dr Oletta Lamas at bedside and ordered one more dose of fentanyl.

## 2014-06-15 NOTE — Progress Notes (Signed)
Dr. Joya Salm at bedside to see pt., states he may go to the room

## 2014-06-15 NOTE — Progress Notes (Signed)
Pt states foot is no longer numb and his pain is gone, Dr. Joya Salm aware of prior  events

## 2014-06-16 LAB — GLUCOSE, CAPILLARY
Glucose-Capillary: 239 mg/dL — ABNORMAL HIGH (ref 70–99)
Glucose-Capillary: 275 mg/dL — ABNORMAL HIGH (ref 70–99)

## 2014-06-16 MED ORDER — DOCUSATE SODIUM 100 MG PO CAPS
100.0000 mg | ORAL_CAPSULE | Freq: Two times a day (BID) | ORAL | Status: DC
  Administered 2014-06-16 – 2014-06-21 (×10): 100 mg via ORAL
  Filled 2014-06-16 (×10): qty 1

## 2014-06-16 MED ORDER — TAMSULOSIN HCL 0.4 MG PO CAPS
0.8000 mg | ORAL_CAPSULE | Freq: Every day | ORAL | Status: DC
  Administered 2014-06-16 – 2014-06-21 (×6): 0.8 mg via ORAL
  Filled 2014-06-16 (×6): qty 2

## 2014-06-16 MED ORDER — ALUM & MAG HYDROXIDE-SIMETH 200-200-20 MG/5ML PO SUSP
30.0000 mL | Freq: Four times a day (QID) | ORAL | Status: DC | PRN
  Administered 2014-06-16: 30 mL via ORAL

## 2014-06-16 MED ORDER — PANTOPRAZOLE SODIUM 40 MG PO TBEC
40.0000 mg | DELAYED_RELEASE_TABLET | Freq: Every day | ORAL | Status: DC
  Administered 2014-06-16 – 2014-06-21 (×6): 40 mg via ORAL
  Filled 2014-06-16 (×6): qty 1

## 2014-06-16 MED ORDER — ALUM & MAG HYDROXIDE-SIMETH 200-200-20 MG/5ML PO SUSP
15.0000 mL | Freq: Four times a day (QID) | ORAL | Status: DC | PRN
  Filled 2014-06-16: qty 30

## 2014-06-16 NOTE — Anesthesia Postprocedure Evaluation (Signed)
  Anesthesia Post-op Note  Patient: Rick [name]  Procedure(s) Performed: Procedure(s): POSTERIOR LUMBAR FUSION 3 LEVEL. Lumbar three/four, four/five, five sacral one. (N/A)  Patient Location: PACU  Anesthesia Type:General  Level of Consciousness: awake, alert , oriented and patient cooperative  Airway and Oxygen Therapy: Patient Spontanous Breathing  Post-op Pain: mild  Post-op Assessment: Post-op Vital signs reviewed, Patient's Cardiovascular Status Stable, Respiratory Function Stable, Patent Airway, No signs of Nausea or vomiting and Pain level controlled  Post-op Vital Signs: stable  Last Vitals:  Filed Vitals:   06/16/14 0749  BP:   Pulse:   Temp:   Resp: 19    Complications: No apparent anesthesia complications

## 2014-06-16 NOTE — Op Note (Signed)
NAMEFLEETWOOD, CAMARDO NO.:  [id number]  MEDICAL RECORD NO.:  [id number]  LOCATION:  [id number]                        FACILITY:  [facility]  PHYSICIAN:  Leeroy Cha, M.D.   DATE OF BIRTH:  10-25-1946  DATE OF PROCEDURE:  06/15/2014 DATE OF DISCHARGE:                              OPERATIVE REPORT   PREOPERATIVE DIAGNOSIS:  L3-L4, L4-5 spondylolisthesis.  Unstable, degenerative disk disease, L5-S1, lumbar stenosis, and chronic radiculopathy.  POSTOPERATIVE DIAGNOSIS:  L3-L4, L4-5 spondylolisthesis.  Unstable, degenerative disk disease, L5-S1, lumbar stenosis, and chronic radiculopathy.  PROCEDURE:  L3-L4 and L4-L5 bilateral laminectomy, bilateral facetectomy, bilateral L3-4 and L4-5 diskectomy beyond normal to be able to introduce the cages.  Decompression of the thecal sac, foraminotomy to decompress the L3, L4, L5, and S1 nerve roots.  Interbody fusion with cages expandable at the level of L3, L4, and L5.  Pedicle screws L3, L4, L5, and S1 bilaterally.  Posterolateral arthrodesis with BMP and autograft.  Cell Saver.  C-arm.  SURGEON:  Leeroy Cha, M.D.  ASSISTANT:  Dr. Annette Stable.  CLINICAL HISTORY:  Mr. Gudiel is a gentleman, who had been seen by Dr. Luiz Ochoa complaining of back pain worsened to both legs.  The patient came to see me because the pain was getting worse.  He had been unable to walk.  Every time he walks, he had to stop after 5 minutes because the pain in both legs associated with tingling sensation.  X-rays showed that he has unstable spondylolisthesis at grade 1 at the level of L3-L4, L4-L5 with severe degenerative disk disease at L5-S1.  The patient has failed conservative treatment.  Because of the finding, we decided to go ahead with surgery.  He knew of the risk and benefits of surgery.  PROCEDURE IN DETAIL:  The patient was taken to the OR, and after intubation, he was positioned in a prone manner.  The back was cleaned with DuraPrep.   Drapes were applied.  Midline incision from L2-3 down to L5-S1 was made and muscle were retracted all the way laterally until we were able to see the lateral facet of L3-L4, L4-L5, L5-S1.  Then with the Leksell, we proceed with removal of the spinous process of L3, L4, L5, as well as the lamina.  The dissection was carried down to be able to remove the facet at those 3 levels.  We investigated the area between L5-S1 and indeed the area was quite narrow.  We were unable to do any diskectomy at this level, although we were able to decompress the L5-S1 nerve root.  At the level of L3-4, first in the right side and then left, we entered the disk space and total gross diskectomy medial and laterally beyond normal were done with removal of the endplates.  At this level, we introduced a expandable cages with BMP inside.  The same procedure was done at L4-5 with bilateral diskectomy and removal of the endplates.  Another 2 sets of cages were inserted.  X-ray showed good position of the cages.  Then, using the C-arm first in AP view and then a lateral view, we probed the pedicle for L3, L4, L5, and S1.  Prior to introduction  of the screws, we investigated all 4 quadrants just to be sure that we were surrounded by bone.  At the level of L3, L4, and L5, we introduced 6 screws of 6.5 x 45 and at the level of S1 the screws were two of them, 6.5 x 55.  There were connected with the rod and cross- link from right to left.  From then on, we went laterally and we removed the periosteum of L3-L4, L4-L5, L5-S1 and a mix of BMP and autograft was used for arthrodesis.  At the level of L3-L4 and L4-L5, we introduced a BMP and autograft laterally beside the cages.  Valsalva maneuver was negative.  The area was irrigated.  The drain was left in the epidural space and the wound was closed with several layers of Vicryl and Steri- Strips.          ______________________________ Leeroy Cha,  M.D.     EB/MEDQ  D:  06/15/2014  T:  06/16/2014  Job:  [id number]

## 2014-06-16 NOTE — Progress Notes (Signed)
Physical Therapy Treatment Patient Details Name: Rick [name] MRN: [id number] DOB: 1946/12/09 Today's Date: 06/16/2014    History of Present Illness Pt is a 68 yo male patient complaining of lumbar pain for almost 5 years not getting better with conservative treament including epidural. 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 Pt now s/p PLIF x 3 levels on 7/7.    PT Comments    Progressing as expected.  Reinforced all education.  Reconciled expectations of therapy treatment frequency.  Follow Up Recommendations  Outpatient PT;Supervision/Assistance - 24 hour;Other (comment) (once cleared for LEstrengthening)     Equipment Recommendations  None recommended by PT    Recommendations for Other Services       Precautions / Restrictions Precautions Precautions: Back;Fall Precaution Comments: Educated pt and family on 3/3 back precautions and incorporating into ADLs.    Mobility  Bed Mobility                  Transfers Overall transfer level: Needs assistance Equipment used: Rolling walker (2 wheeled) Transfers: Sit to/from Stand Sit to Stand: Min guard         General transfer comment: VC's for hand placement and sequencing, however no physical assist needed to power up. Pt required closed min guard, however responded well when told to push through LEs to stand.   Ambulation/Gait Ambulation/Gait assistance: Min guard Ambulation Distance (Feet): 600 Feet Assistive device: Rolling walker (2 wheeled) Gait Pattern/deviations: Step-through pattern Gait velocity: slow, but able to appreciably increase speed to command   General Gait Details: knees buckled times 1 during turn around to back up to chair   Stairs            Wheelchair Mobility    Modified Rankin (Stroke Patients Only)       Balance Overall balance assessment: No apparent balance deficits (not formally assessed)                                   Cognition Arousal/Alertness: Awake/alert Behavior During Therapy: WFL for tasks assessed/performed Overall Cognitive Status: Within Functional Limits for tasks assessed                      Exercises      General Comments General comments (skin integrity, edema, etc.): reinforced all back care/prec.  log roll, lifting precautions and progression of activity.      Pertinent Vitals/Pain 5/10 with pain meds    Home Living                      Prior Function            PT Goals (current goals can now be found in the care plan section) Acute Rehab PT Goals Patient Stated Goal: to go home PT Goal Formulation: With patient/family Time For Goal Achievement: 06/23/14 Potential to Achieve Goals: Good Progress towards PT goals: Progressing toward goals    Frequency  Min 5X/week    PT Plan      Co-evaluation             End of Session   Activity Tolerance: Patient tolerated treatment well Patient left: in chair;with call bell/phone within reach;with family/visitor present     Time: [id number] PT Time Calculation (min): 24 min  Charges:  $Gait Training: 8-22 mins $Self Care/Home Management: 8-22  G Codes:      Keron Neenan, Tessie Fass 06/16/2014, 5:35 PM 06/16/2014  Donnella Sham, [place] [phone]  (pager)

## 2014-06-16 NOTE — Evaluation (Signed)
Occupational Therapy Evaluation Patient Details Name: Rick [name] MRN: [id number] DOB: 1946-02-05 Today's Date: 06/16/2014    History of Present Illness Pt is a 68 yo male patient complaining of lumbar pain for almost 5 years not getting better with conservative treament including epidural. 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 Pt now s/p PLIF x 3 levels on 7/7.   Clinical Impression   PTA pt lived at home and was independent with ADLs and functional mobility. Pt overall moving well, however limited by pain. Educated pt and family on precautions and incorporating into ADLs with demonstration and pt teach back. Pt would benefit from skilled OT to increase independence with ADLs prior to d/c.     Follow Up Recommendations  No OT follow up;Supervision/Assistance - 24 hour    Equipment Recommendations  None recommended by OT       Precautions / Restrictions Precautions Precautions: Back;Fall Precaution Booklet Issued:  (in room) Precaution Comments: Educated pt and family on 3/3 back precautions and incorporating into ADLs. Restrictions Weight Bearing Restrictions: No      Mobility Bed Mobility Overal bed mobility: Needs Assistance Bed Mobility: Rolling;Sit to Sidelying Rolling: Min guard Sidelying to sit: Min guard     Sit to sidelying: Min assist (for LLEs onto bed) General bed mobility comments: VC's for sequencing and use of log roll technique. Assist for LLEs onto bed  Transfers Overall transfer level: Needs assistance Equipment used: Rolling walker (2 wheeled) Transfers: Sit to/from Stand Sit to Stand: Min guard         General transfer comment: VC's for hand placement and sequencing, however no physical assist needed to power up. Pt required closed min guard, however responded well when told to push through LEs to stand.          ADL Overall ADL's : Needs assistance/impaired Eating/Feeding:  Independent;Sitting   Grooming: Min guard;Standing   Upper Body Bathing: Set up;Sitting   Lower Body Bathing: Sit to/from stand;Minimal assistance   Upper Body Dressing : Set up;Sitting   Lower Body Dressing: Minimal assistance;Sit to/from stand   Toilet Transfer: Min guard;Ambulation;BSC;RW   Toileting- Clothing Manipulation and Hygiene: Adhering to back precautions;Sit to/from stand;Moderate assistance Toileting - Clothing Manipulation Details (indicate cue type and reason): Educated pt and family on use of toilet aid (or tongs and baby wipes) for toilet hygiene Tub/ Shower Transfer: Walk-in shower;Min guard;Ambulation;Rolling walker   Functional mobility during ADLs: Min guard;Rolling walker General ADL Comments: Pt moved slowly and limited by pain, however doing well. Educated pt and family on incorporating back precautions into ADLs. Education and training completed for fall prevention, safety with DME, and compensatory techniques for LB ADLs.      Vision  Pt reports no change from baseline.   No apparent visual deficits                 Perception Perception Perception Tested?: No   Praxis Praxis Praxis tested?: Within functional limits    Pertinent Vitals/Pain Pt complains of pain level 7-8/10 when sitting in recliner and reports improvement in pain when standing. Repositioned pt in bed with encouragement to ambulate throughout the day and sit up in recliner for meals.      Hand Dominance Right   Extremity/Trunk Assessment Upper Extremity Assessment Upper Extremity Assessment: Overall WFL for tasks assessed   Lower Extremity Assessment Lower Extremity Assessment: Defer to PT evaluation   Cervical / Trunk Assessment Cervical /  Trunk Assessment: Normal   Communication Communication Communication: No difficulties   Cognition Arousal/Alertness: Awake/alert Behavior During Therapy: WFL for tasks assessed/performed Overall Cognitive Status: Within  Functional Limits for tasks assessed                                Home Living Family/patient expects to be discharged to:: Private residence Living Arrangements: Spouse/significant other;Children Available Help at Discharge: Family;Available PRN/intermittently Type of Home: House Home Access: Stairs to enter [employer] of Steps: 1 Entrance Stairs-Rails: None Home Layout: One level;Laundry or work area in basement     [employer]: Occupational psychologist: Handicapped [place]: Environmental consultant - 2 wheels          Prior Functioning/Environment Level of Independence: Independent             OT Diagnosis: Generalized weakness;Acute pain   OT Problem List: Decreased strength;Decreased range of motion;Decreased activity tolerance;Impaired balance (sitting and/or standing);Decreased safety awareness;Decreased knowledge of use of DME or AE;Decreased knowledge of precautions;Pain   OT Treatment/Interventions: Self-care/ADL training;Therapeutic exercise;Energy conservation;DME and/or AE instruction;Therapeutic activities;Patient/family education;Balance training    OT Goals(Current goals can be found in the care plan section) Acute Rehab OT Goals Patient Stated Goal: to go home OT Goal Formulation: With patient/family Time For Goal Achievement: 06/23/14 Potential to Achieve Goals: Good ADL Goals Pt Will Perform Grooming: with modified independence;standing Pt Will Perform Lower Body Bathing: with modified independence;sit to/from stand Pt Will Perform Lower Body Dressing: with modified independence;sit to/from stand Pt Will Transfer to Toilet: with modified independence;ambulating;regular height toilet Pt Will Perform Toileting - Clothing Manipulation and hygiene: with supervision;with adaptive equipment;sit to/from stand Pt Will Perform Tub/Shower Transfer: Shower transfer;with modified independence;ambulating;rolling  walker Additional ADL Goal #1: Pt will perform bed mobility using log roll with Supervision to prepare for ADLs. Additional ADL Goal #2: Pt will Independently verbalize 3/3 back precautions.  OT Frequency: Min 2X/week              End of Session Equipment Utilized During Treatment: Gait belt;Rolling walker  Activity Tolerance: Patient limited by pain Patient left: in bed;with call bell/phone within reach;with family/visitor present   Time: 0911-0939 OT Time Calculation (min): 28 min Charges:  OT General Charges $OT Visit: 1 Procedure OT Evaluation $Initial OT Evaluation Tier I: 1 Procedure OT Treatments $Self Care/Home Management : 8-22 mins  [name] [id number] 06/16/2014, 9:50 AM

## 2014-06-16 NOTE — Evaluation (Signed)
Physical Therapy Evaluation Patient Details Name: Rick [name] MRN: [id number] DOB: 09-25-1946 Today's Date: 06/16/2014   History of Present Illness  Pt is a 68 yo male patient complaining of lumbar pain for almost 5 years not getting better with conservative treament including epidural. 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 Pt now s/p PLIF x 3 levels on 7/7.  Clinical Impression  Pt admitted with above. Pt tolerated OOB mobility well for first time. Pt with bilat LE weakness and pain and would benefit from outpt PT once cleared by MD. Pt with good home set up and support.    Follow Up Recommendations Outpatient PT;Supervision/Assistance - 24 hour (once cleared by MD for LE strengthening)    Equipment Recommendations  None recommended by PT    Recommendations for Other Services       Precautions / Restrictions Precautions Precautions: Back;Fall Precaution Booklet Issued: Yes (comment) Precaution Comments: pt and family with verbal confirmation of understanding Restrictions Weight Bearing Restrictions: No      Mobility  Bed Mobility Overal bed mobility: Needs Assistance Bed Mobility: Rolling;Sidelying to Sit Rolling: Min guard Sidelying to sit: Min guard       General bed mobility comments: max directional v/c's  Transfers Overall transfer level: Needs assistance Equipment used: Rolling walker (2 wheeled) Transfers: Sit to/from Stand Sit to Stand: Min assist         General transfer comment: minA for initial power up into standing. Pt required significant increase in time. v/c's for hand placement  Ambulation/Gait Ambulation/Gait assistance: Min guard Ambulation Distance (Feet): 60 Feet Assistive device: Rolling walker (2 wheeled) Gait Pattern/deviations: Decreased stride length;Step-to pattern Gait velocity: slow   General Gait Details: v/c's to con't push RW to improve fluidity of gait pattern.  Pt with significant increase in UE WBing with c/o "my right arm is falling asleep" pt encouraged to rely more on LEs.  Stairs            Wheelchair Mobility    Modified Rankin (Stroke Patients Only)       Balance                                             Pertinent Vitals/Pain 8/10 surgical back pain, pt hit PCA pump x2 during eval    Home Living Family/patient expects to be discharged to:: Private residence Living Arrangements: Spouse/significant other;Children Available Help at Discharge: Family;Available PRN/intermittently Type of Home: House Home Access: Stairs to enter Entrance Stairs-Rails: None Entrance Stairs-Number of Steps: 1 Home Layout: One level;Laundry or work area in [place]: Environmental consultant - 2 wheels      Prior Function Level of Independence: Independent               Hand Dominance   Dominant Hand: Right    Extremity/Trunk Assessment   Upper Extremity Assessment: Overall WFL for tasks assessed           Lower Extremity Assessment: Generalized weakness (due to bilat LE pain, limited bilat knee ext )      Cervical / Trunk Assessment:  (recent back surgery)  Communication   Communication: No difficulties  Cognition Arousal/Alertness: Awake/alert Behavior During Therapy: WFL for tasks assessed/performed Overall Cognitive Status: Within Functional Limits for tasks assessed  General Comments      Exercises        Assessment/Plan    PT Assessment Patient needs continued PT services  PT Diagnosis Difficulty walking;Abnormality of gait;Generalized weakness;Acute pain   PT Problem List Decreased strength;Decreased activity tolerance;Decreased balance;Decreased mobility;Pain  PT Treatment Interventions Gait training;DME instruction;Stair training;Functional mobility training;Therapeutic activities;Therapeutic exercise   PT Goals (Current goals can be found in the Care Plan  section) Acute Rehab PT Goals Patient Stated Goal: home PT Goal Formulation: With patient/family Time For Goal Achievement: 06/23/14 Potential to Achieve Goals: Good    Frequency Min 5X/week   Barriers to discharge        Co-evaluation               End of Session Equipment Utilized During Treatment: Gait belt Activity Tolerance: Patient tolerated treatment well Patient left: in chair;with call bell/phone within reach;with family/visitor present Nurse Communication: Mobility status         Time: 0728-0806 PT Time Calculation (min): 38 min   Charges:   PT Evaluation $Initial PT Evaluation Tier I: 1 Procedure PT Treatments $Gait Training: 8-22 mins $Therapeutic Activity: 8-22 mins   PT G CodesKingsley Callander 06/16/2014, 8:29 AM  Kittie Plater, PT, DPT Pager #: [phone] Office #: [phone]

## 2014-06-16 NOTE — Progress Notes (Signed)
UR complete.  Jonathon Castelo RN, MSN 

## 2014-06-16 NOTE — Progress Notes (Signed)
Patient ID: Rick [name], male   DOB: 1945-12-30, 68 y.o.   MRN: [id number] Ambulating, sensory nl. Drain working well.see orders

## 2014-06-17 LAB — GLUCOSE, CAPILLARY
GLUCOSE-CAPILLARY: 247 mg/dL — AB (ref 70–99)
GLUCOSE-CAPILLARY: 248 mg/dL — AB (ref 70–99)
Glucose-Capillary: 227 mg/dL — ABNORMAL HIGH (ref 70–99)

## 2014-06-17 MED ORDER — FLEET ENEMA 7-19 GM/118ML RE ENEM: 1.0000 | ENEMA | Freq: Every day | RECTAL | Status: DC | PRN

## 2014-06-17 MED ORDER — OXYCODONE HCL 5 MG PO TABS
20.0000 mg | ORAL_TABLET | ORAL | Status: DC | PRN
  Administered 2014-06-17 – 2014-06-18 (×6): 20 mg via ORAL
  Filled 2014-06-17 (×6): qty 4

## 2014-06-17 MED ORDER — METFORMIN HCL 500 MG PO TABS
1000.0000 mg | ORAL_TABLET | Freq: Two times a day (BID) | ORAL | Status: DC
  Administered 2014-06-17 – 2014-06-21 (×8): 1000 mg via ORAL
  Filled 2014-06-17 (×8): qty 2

## 2014-06-17 NOTE — Progress Notes (Signed)
Patient ID: Rick [name], male   DOB: Sep 11, 1946, 68 y.o.   MRN: [id number] Still c/o incisional pain. Moves both legs well. No bm. Spoke with family about next steo and the need to stop morphine and more ambulation. Continue pt. Rehabilitation to see.

## 2014-06-17 NOTE — Progress Notes (Signed)
Physical Therapy Treatment Patient Details Name: Rick [name] MRN: [id number] DOB: 06-25-1946 Today's Date: 06/17/2014    History of Present Illness Pt is a 68 yo male patient complaining of lumbar pain for almost 5 years not getting better with conservative treament including epidural. 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 Pt now s/p PLIF x 3 levels on 7/7. Pt with significant PMHx of HTN, R shoulder surgery, L knee replacement, DM.     PT Comments    Pt is progressing well with his mobility.  He has already walked twice this morning with staff and now our emphasis of this session is stair training for home entry and reinforcement of precautions and activity progression.  One of his daughters was present for education.  PT will continue to follow acutely.    Follow Up Recommendations  Supervision for mobility/OOB;Outpatient PT (when MD deems appropriate for LE strengthening. )     Equipment Recommendations  None recommended by PT    Recommendations for Other Services   NA     Precautions / Restrictions Precautions Precautions: Back;Fall Precaution Comments: Reviewed back precautions, lifting restrictions, encouraged gait 3 times per day, and to avoid sitting for greater than 30-45 mins at a time.     Mobility                 Transfers Overall transfer level: Needs assistance Equipment used: Rolling walker (2 wheeled) Transfers: Sit to/from Stand Sit to Stand: Min guard         General transfer comment: Min guard assist for safety due to reported left leg weakness.  Verbal cues to reinforce safe hand placement.   Ambulation/Gait Ambulation/Gait assistance: Min assist Ambulation Distance (Feet): 45 Feet Assistive device: Rolling walker (2 wheeled) Gait Pattern/deviations: Step-through pattern;Shuffle;Trunk flexed (left leg buckling at times, compensates with RW) Gait velocity: decreased Gait velocity  interpretation: Below normal speed for age/gender General Gait Details: Left knee continues to buckle at times during gait.  Pt is compensating well for this with upper extremites on RW for support.  Verbal cues to stay closer to RW, upright posutre during gait.    Stairs Stairs: Yes Stairs assistance: Min assist Stair Management: Two rails;Step to pattern;Forwards Number of Stairs: 2 General stair comments: Educated pt and daughter re: safest stair climbing technique (up with good down with weaker).  Will need to somehow simulate and/or explain how this would work for his one step for home entry with RW at d/c.           Balance Overall balance assessment: Needs assistance         Standing balance support: Bilateral upper extremity supported;Single extremity supported;No upper extremity supported Standing balance-Leahy Scale: Fair                      Cognition Arousal/Alertness: Awake/alert Behavior During Therapy: WFL for tasks assessed/performed Overall Cognitive Status: Within Functional Limits for tasks assessed                             Pertinent Vitals/Pain See vitals flow sheet.            PT Goals (current goals can now be found in the care plan section) Acute Rehab PT Goals Patient Stated Goal: to go home Progress towards PT goals: Progressing toward goals    Frequency  Min 5X/week  PT Plan Current plan remains appropriate       End of Session Equipment Utilized During Treatment: Gait belt Activity Tolerance: Patient limited by pain Patient left: in chair;with call bell/phone within reach;with chair alarm set;with family/visitor present     Time: 1202-1224 PT Time Calculation (min): 22 min  Charges:  $Gait Training: 8-22 mins                      Rick [name], PT, DPT [phone]   06/17/2014, 1:20 PM

## 2014-06-17 NOTE — Progress Notes (Signed)
Inpatient Diabetes Program Recommendations  AACE/ADA: New Consensus Statement on Inpatient Glycemic Control (2013)  Target Ranges:  Prepandial:   less than 140 mg/dL      Peak postprandial:   less than 180 mg/dL (1-2 hours)      Critically ill patients:  140 - 180 mg/dL     Results for Rick [name], Rick [name] (MRN [id number]) as of 06/17/2014 11:56  Ref. Range 06/17/2014 06:51  Glucose-Capillary Latest Range: 70-99 mg/dL 247 (H)     Patient admitted for back surgery.  Has history of DM2, HTN.  Home DM Meds:   Trulicity injection 1.5 mg Qweek Metformin 1000 mg bid    MD- Please start Novolog Moderate SSI tid ac + HS while patient is hospitalized.  Can restart Home DM medications at time of d/c.    Will follow Wyn Quaker RN, MSN, CDE Diabetes Coordinator Inpatient Diabetes Program Team Pager: [phone] (8a-10p)

## 2014-06-18 LAB — GLUCOSE, CAPILLARY
GLUCOSE-CAPILLARY: 205 mg/dL — AB (ref 70–99)
Glucose-Capillary: 228 mg/dL — ABNORMAL HIGH (ref 70–99)
Glucose-Capillary: 173 mg/dL — ABNORMAL HIGH (ref 70–99)
Glucose-Capillary: 163 mg/dL — ABNORMAL HIGH (ref 70–99)
Glucose-Capillary: 209 mg/dL — ABNORMAL HIGH (ref 70–99)

## 2014-06-18 MED ORDER — GABAPENTIN 300 MG PO CAPS
300.0000 mg | ORAL_CAPSULE | Freq: Three times a day (TID) | ORAL | Status: DC
  Administered 2014-06-18 – 2014-06-21 (×9): 300 mg via ORAL
  Filled 2014-06-18 (×9): qty 1

## 2014-06-18 MED ORDER — FLEET ENEMA 7-19 GM/118ML RE ENEM
1.0000 | ENEMA | Freq: Every day | RECTAL | Status: DC | PRN
  Administered 2014-06-18: 1 via RECTAL
  Filled 2014-06-18: qty 1

## 2014-06-18 NOTE — Progress Notes (Signed)
Physical Therapy Treatment Patient Details Name: Rick [name] MRN: [id number] DOB: 06-Sep-1946 Today's Date: 06/18/2014    History of Present Illness Pt is a 68 yo male patient complaining of lumbar pain for almost 5 years not getting better with conservative treament including epidural. 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 Pt now s/p PLIF x 3 levels on 7/7. Pt with significant PMHx of HTN, R shoulder surgery, L knee replacement, DM.     PT Comments    Pt is now POD #3 s/p lumbar surgery.  He is moving better with increased confidence, but continues to have weakness in his left leg.  Pt was able to show proficiency in practicing simulated home entry.  I have not yet seen him get into and out of bed.  Hopefully therapist tomorrow can practice bed mobility with HOB flat and no rail.   PT to follow acutely for deficits listed below.     Follow Up Recommendations  Supervision for mobility/OOB     Equipment Recommendations  None recommended by PT    Recommendations for Other Services   NA     Precautions / Restrictions Precautions Precautions: Back;Fall Precaution Booklet Issued: Yes (comment) Precaution Comments: Reviewed back precautions, lifting restrictions, encouraged gait 3 times per day, and to avoid sitting for greater than 30-45 mins at a time.     Mobility     Transfers Overall transfer level: Needs assistance Equipment used: Rolling walker (2 wheeled) Transfers: Sit to/from Stand Sit to Stand: Min guard         General transfer comment: Min guard assist for safety during transitions as pt is coming up over flexed knees.   Ambulation/Gait Ambulation/Gait assistance: Min guard Ambulation Distance (Feet): 5 Feet Assistive device: Rolling walker (2 wheeled) Gait Pattern/deviations: Step-through pattern;Shuffle     General Gait Details: Only walked short distance in room to practice stoop to enter  home.  Pt has already walked with RN staff this AM.    Stairs Stairs: Yes Stairs assistance: Min assist Stair Management: Step to pattern;Forwards;With walker Number of Stairs: 1 General stair comments: Educated pt and his wife re: safest technique for home entry with RW.  Wife and pt verbalized correct leg sequencing and she assisted with RW management. Min assist to support trunk and help with movement of RW up and down the step.          Balance Overall balance assessment: Needs assistance Sitting-balance support: Feet supported;Bilateral upper extremity supported Sitting balance-Leahy Scale: Good     Standing balance support: Bilateral upper extremity supported;Single extremity supported;No upper extremity supported Standing balance-Leahy Scale: Fair                      Cognition Arousal/Alertness: Awake/alert Behavior During Therapy: WFL for tasks assessed/performed Overall Cognitive Status: Within Functional Limits for tasks assessed                             Pertinent Vitals/Pain See vitals flow sheet.            PT Goals (current goals can now be found in the care plan section) Acute Rehab PT Goals Patient Stated Goal: to go home Progress towards PT goals: Progressing toward goals    Frequency  Min 5X/week    PT Plan Current plan remains appropriate       End of Session  Equipment Utilized During Treatment: Gait belt Activity Tolerance: Patient limited by pain Patient left: in chair;with call bell/phone within reach;with family/visitor present     Time: [id number] PT Time Calculation (min): 27 min  Charges:  $Gait Training: 8-22 mins $Self Care/Home Management: 8-22                     Chyrel Taha B. Jontavious Commons, [place], DPT [phone]   06/18/2014, 5:59 PM

## 2014-06-18 NOTE — Progress Notes (Signed)
PT Cancellation Note  Patient Details Name: MARCHELLO ROHAL MRN: [id number] DOB: 1946/04/17   Cancelled Treatment:    Reason Eval/Treat Not Completed: Pain limiting ability to participate.  Pt already walked with RN staff and just received pain meds from RN. He would prefer to wait until after lunch to work with PT again.  Thanks,    Barbarann Ehlers. [place], [place], DPT [phone]   06/18/2014, 11:58 AM

## 2014-06-18 NOTE — Progress Notes (Signed)
CARE MANAGEMENT NOTE 06/18/2014  Patient:  Rick [name], Rick [name]   Account Number:  [id number]  Date Initiated:  06/16/2014  Documentation initiated by:  Lorne Skeens  Subjective/Objective Assessment:   Patient admitted with PLIF. Lives at home with spouse.     Action/Plan:   Will follow for discharge needs pending PT/OT evals and physician orders.   Anticipated DC Date:     Anticipated DC Plan:  [place]  CM consult      Choice offered to / List presented to:             Status of service:  In process, will continue to follow Medicare Important Message given?  YES (If response is "NO", the following Medicare IM given date fields will be blank) Date Medicare IM given:  06/18/2014 Medicare IM given by:  Lorne Skeens Date Additional Medicare IM given:   Additional Medicare IM given by:    Discharge Disposition:    Per UR Regulation:  Reviewed for med. necessity/level of care/duration of stay  If discussed at [place] of Stay Meetings, dates discussed:    Comments:  06/18/14 [place], MSN, CM- Met with patient to provide Medicare IM letter. Verbalized understanding.

## 2014-06-18 NOTE — Progress Notes (Signed)
OT Cancellation Note  Patient Details Name: Rick [name] MRN: [id number] DOB: 04-Nov-1946   Cancelled Treatment:    Reason Eval/Treat Not Completed: Patient declined, no reason specified. Attempted to see pt x2 today, however pt sleeping and family declined. OT will follow up with pt as available.   [name] [id number] 06/18/2014, 5:45 PM

## 2014-06-18 NOTE — Progress Notes (Signed)
Patient ID: Rick [name], male   DOB: 08-14-1946, 68 y.o.   MRN: [id number] Still with incisional pain. Ambulating. No enema given

## 2014-06-19 LAB — GLUCOSE, CAPILLARY
GLUCOSE-CAPILLARY: 169 mg/dL — AB (ref 70–99)
Glucose-Capillary: 166 mg/dL — ABNORMAL HIGH (ref 70–99)

## 2014-06-19 MED ORDER — POLYETHYLENE GLYCOL 3350 17 G PO PACK
17.0000 g | PACK | Freq: Every day | ORAL | Status: DC
  Administered 2014-06-19 – 2014-06-21 (×3): 17 g via ORAL
  Filled 2014-06-19 (×3): qty 1

## 2014-06-19 MED ORDER — BISACODYL 10 MG RE SUPP
10.0000 mg | Freq: Once | RECTAL | Status: AC
  Administered 2014-06-19: 10 mg via RECTAL
  Filled 2014-06-19: qty 1

## 2014-06-19 MED ORDER — MAGNESIUM CITRATE PO SOLN
1.0000 | Freq: Once | ORAL | Status: AC
  Administered 2014-06-19: 1 via ORAL
  Filled 2014-06-19: qty 296

## 2014-06-19 NOTE — Progress Notes (Signed)
Subjective: Patient reports Still of back pain but leg is improving strength improving  Objective: Vital signs in last 24 hours: Temp:  [99.1 F (37.3 C)-99.9 F (37.7 C)] 99.1 F (37.3 C) (07/11 0259) Pulse Rate:  [99-108] 104 (07/11 0259) Resp:  [18-20] 20 (07/11 0259) BP: (120-135)/(53-67) 135/67 mmHg (07/11 0259) SpO2:  [94 %-100 %] 94 % (07/11 0259)  Intake/Output from previous day:   Intake/Output this shift:    Strength out of 5 incision was a moderate saturation  Lab Results: No results found for this basename: WBC, HGB, HCT, PLT,  in the last 72 hours BMET No results found for this basename: NA, K, CL, CO2, GLUCOSE, BUN, CREATININE, CALCIUM,  in the last 72 hours  Studies/Results: No results found.  Assessment/Plan: Continue to mobilize physical therapy change dressing work on bowels  LOS: 4 days     Corby Vandenberghe P 06/19/2014, 8:03 AM

## 2014-06-19 NOTE — Progress Notes (Signed)
Physical Therapy Treatment Patient Details Name: Rick [name] MRN: [id number] DOB: May 14, 1946 Today's Date: 06/19/2014    History of Present Illness Pt is a 68 yo male patient complaining of lumbar pain for almost 5 years not getting better with conservative treament including epidural. 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 Pt now s/p PLIF x 3 levels on 7/7. Pt with significant PMHx of HTN, R shoulder surgery, L knee replacement, DM.     PT Comments    Pt has been set back today by bowel and cognitive issues and was slow to process throughout treatment. However, with encouragement, pt was able to ambulate 100' with min A and RW. With setbacks he has faced since surgery, recommend HHPT to follow after d/c as well as RW. PT will continue to follow acutely.   Follow Up Recommendations  Supervision for mobility/OOB;Home health PT     Equipment Recommendations  None recommended by PT    Recommendations for Other Services       Precautions / Restrictions Precautions Precautions: Back;Fall Precaution Comments: pt recalled no bending but needed visual cues to recall no arching or twisting Restrictions Weight Bearing Restrictions: No    Mobility  Bed Mobility Overal bed mobility: Needs Assistance Bed Mobility: Rolling;Sidelying to Sit Rolling: Supervision Sidelying to sit: Supervision       General bed mobility comments: did not practice with bed flat because pt has been dizzy today, will practice tomorrow if he is feeling better. With rail and HOB elevated 30 degrees, pt able to get up from SL with supervision  Transfers Overall transfer level: Needs assistance Equipment used: Rolling walker (2 wheeled) Transfers: Sit to/from Stand Sit to Stand: Min assist         General transfer comment: pt required min A for power up from bed and from chair. Able to wt-shift fwd but then has difficulty fully extending  knees to achieve standing  Ambulation/Gait Ambulation/Gait assistance: Min assist Ambulation Distance (Feet): 100 Feet (1 seated rest break) Assistive device: Rolling walker (2 wheeled) Gait Pattern/deviations: Decreased step length - right;Decreased stance time - right;Decreased weight shift to left Gait velocity: decreased   General Gait Details: pt ambulates with increased wt through UE's and antalgic pattern due to pain and weakness LLE. Increased bilateral knee flexion throughout stance. Very slow but hopeful that ambulation will help bowel issues as well as lethargy   Stairs            Wheelchair Mobility    Modified Rankin (Stroke Patients Only)       Balance Overall balance assessment: Needs assistance Sitting-balance support: No upper extremity supported;Feet supported Sitting balance-Leahy Scale: Good     Standing balance support: Bilateral upper extremity supported;During functional activity Standing balance-Leahy Scale: Poor Standing balance comment: unable to stand without UE support today                    Cognition Arousal/Alertness: Lethargic Behavior During Therapy: WFL for tasks assessed/performed Overall Cognitive Status: Impaired/Different from baseline Area of Impairment: Problem solving;Orientation Orientation Level: Time   Memory: Decreased recall of precautions       Problem Solving: Slow processing General Comments: pt's family reports that he has been confused today, he is lethargic, slow to process    Exercises      General Comments General comments (skin integrity, edema, etc.): pt did become more verbal and was appropriate with conversation when ambulating.  Left up in chair after session.      Pertinent Vitals/Pain 4/10 back pain, positioned for comfort after session    Home Living                      Prior Function            PT Goals (current goals can now be found in the care plan section) Acute  Rehab PT Goals Patient Stated Goal: to go home PT Goal Formulation: With patient/family Time For Goal Achievement: 06/23/14 Potential to Achieve Goals: Good Progress towards PT goals: Progressing toward goals    Frequency  Min 5X/week    PT Plan Current plan remains appropriate    Co-evaluation             End of Session Equipment Utilized During Treatment: Gait belt Activity Tolerance: Patient limited by lethargy Patient left: in chair;with chair alarm set;with family/visitor present;with call bell/phone within reach     Time: 1526-1550 PT Time Calculation (min): 24 min  Charges:  $Gait Training: 23-37 mins                    G Codes:     Leighton Roach, [place]  [phone]  Leighton Roach 06/19/2014, 3:58 PM

## 2014-06-19 NOTE — Progress Notes (Signed)
Paged MD because patient has been given an enema, miralax and dulcolax suppository and still is unable to have a bowl.  Awaiting for further orders.  Will continue to monitor patient.

## 2014-06-20 LAB — GLUCOSE, CAPILLARY
Glucose-Capillary: 226 mg/dL — ABNORMAL HIGH (ref 70–99)
Glucose-Capillary: 183 mg/dL — ABNORMAL HIGH (ref 70–99)

## 2014-06-20 NOTE — Progress Notes (Signed)
Physical Therapy Treatment Patient Details Name: Rick [name] MRN: [id number] DOB: Mar 25, 1946 Today's Date: 06/20/2014    History of Present Illness Pt is a 68 yo male patient complaining of lumbar pain for almost 5 years not getting better with conservative treament including epidural. 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 Pt now s/p PLIF x 3 levels on 7/7. Pt with significant PMHx of HTN, R shoulder surgery, L knee replacement, DM.     PT Comments    Pt presents with less confusion and incr mobility today.  Hopeful to d/c home today.  Moving well,  Continue to recommend HHPT for progression after d/c and eventual transition off RW.    Follow Up Recommendations  Supervision for mobility/OOB;Home health PT     Equipment Recommendations  None recommended by PT    Recommendations for Other Services       Precautions / Restrictions Precautions Precautions: Back;Fall Restrictions Weight Bearing Restrictions: No    Mobility  Bed Mobility Overal bed mobility: Modified Independent Bed Mobility: Sit to Sidelying;Sidelying to Sit;Rolling Rolling: Modified independent (Device/Increase time) Sidelying to sit: Modified independent (Device/Increase time)     Sit to sidelying: Supervision General bed mobility comments: bed flat, no rail, pt able to perform with 1-2 instructional cues and incr time  Transfers Overall transfer level: Needs assistance Equipment used: Rolling walker (2 wheeled);None Transfers: Sit to/from Stand Sit to Stand: Supervision         General transfer comment: educated on stand strong/sit slow for incr control and strength, able to return demo though struggles with initial lift off  Ambulation/Gait Ambulation/Gait assistance: Supervision Ambulation Distance (Feet): 350 Feet Assistive device: Rolling walker (2 wheeled) Gait Pattern/deviations: Step-through pattern;Shuffle;Trunk  flexed Gait velocity: decreased Gait velocity interpretation: Below normal speed for age/gender General Gait Details: educated on shoulder/hip alignment and incr stride length for decr reliance on RW and incr load through back hips;  pt goal to not need device for walking; educated on multipe short bouts for decr sorness and incr bowel motility   Stairs            Wheelchair Mobility    Modified Rankin (Stroke Patients Only)       Balance                                    Cognition Arousal/Alertness: Awake/alert Behavior During Therapy: WFL for tasks assessed/performed Overall Cognitive Status: Within Functional Limits for tasks assessed                 General Comments: no confusion noted today, pt attributes to oxycodone, feels better today    Exercises      General Comments        Pertinent Vitals/Pain no apparent distress with no pain, sore in back    Home Living                      Prior Function            PT Goals (current goals can now be found in the care plan section) Acute Rehab PT Goals Patient Stated Goal: home today Progress towards PT goals: Progressing toward goals    Frequency  Min 5X/week    PT Plan Current plan remains appropriate    Co-evaluation  End of Session   Activity Tolerance: Patient tolerated treatment well Patient left: in bed;with call bell/phone within reach;with family/visitor present     Time: 1020-1050 PT Time Calculation (min): 30 min  Charges:  $Gait Training: 8-22 mins $Therapeutic Activity: 8-22 mins                    G Codes:      Herbie Drape 06/20/2014, 11:20 AM

## 2014-06-20 NOTE — Progress Notes (Signed)
Filed Vitals:   06/19/14 1049 06/19/14 1700 06/19/14 2227 06/20/14 0700  BP: 143/61 147/72 120/65 136/74  Pulse: 104 94 84 109  Temp: 100.9 F (38.3 C)  99.5 F (37.5 C) 100.2 F (37.9 C)  TempSrc: Oral Oral Oral Oral  Resp: 20 20 20 18   Height:      Weight:      SpO2: 95% 97% 97% 95%    Patient becoming more comfortable. Abdomen had been quite distended, less distended now. Has been mostly passing gas, but has had a small bowel movement. Wound clean and dry, healing well. Has been ambulating some in the halls.  Plan: Nursing staff to continue to work with patient on bowel function, encouraged patient ambulate actively in halls, and drink plenty of fluids.  Hosie Spangle, MD 06/20/2014, 9:27 AM

## 2014-06-21 LAB — POCT I-STAT 4, (NA,K, GLUC, HGB,HCT)
Glucose, Bld: 178 mg/dL — ABNORMAL HIGH (ref 70–99)
HCT: 42 % (ref 39.0–52.0)
Hemoglobin: 14.3 g/dL (ref 13.0–17.0)
Potassium: 6.5 mEq/L (ref 3.7–5.3)
SODIUM: 171 meq/L — AB (ref 137–147)

## 2014-06-21 LAB — GLUCOSE, CAPILLARY: GLUCOSE-CAPILLARY: 174 mg/dL — AB (ref 70–99)

## 2014-06-21 NOTE — Progress Notes (Signed)
Discharge orders received. Prescriptions for Lortab and valium given to patients daughter by Dr Joya Salm. Discharge instructions and medications reviewed with patient and family member. Instructed to call dr boteros office for a follow up appointment in 3 weeks. Patient verbalized understanding, discharged home via wheelchair with 3N1 BSC.

## 2014-06-21 NOTE — Progress Notes (Addendum)
Occupational Therapy Treatment Patient Details Name: Rick [name] MRN: [id number] DOB: 1946-02-16 Today's Date: 06/21/2014    History of present illness Pt is a 68 yo male patient complaining of lumbar pain for almost 5 years not getting better with conservative treament including epidural. 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 Pt now s/p PLIF x 3 levels on 7/7. Pt with significant PMHx of HTN, R shoulder surgery, L knee replacement, DM.    OT comments  Pt moving well during session. Planning to d/c home today. Daughter present for education as well.  Follow Up Recommendations  No OT follow up;Supervision/Assistance - 24 hour    Equipment Recommendations  3 in 1 bedside comode    Recommendations for Other Services      Precautions / Restrictions Precautions Precautions: Back;Fall Precaution Comments: Pt able to state 3/3 precautions at end of session Restrictions Weight Bearing Restrictions: No       Mobility Bed Mobility Overal bed mobility: Needs Assistance   Rolling: Modified independent (Device/Increase time) Sidelying to sit: Supervision       General bed mobility comments: cues to try to use left arm to push up from bed to avoid twisting.  Transfers Overall transfer level: Needs assistance Equipment used: Rolling walker (2 wheeled);None Transfers: Sit to/from Stand Sit to Stand: Supervision;Min guard         General transfer comment: cues for technique.    Balance                                   ADL Overall ADL's : Needs assistance/impaired                 Upper Body Dressing : Set up;Supervision/safety;Sitting   Lower Body Dressing: Set up;Supervision/safety;Sit to/from stand   Toilet Transfer: Supervision/safety;Ambulation;RW;Comfort height toilet;Grab bars       Tub/ Shower Transfer: Walk-in shower;Min guard;Ambulation;3 in 1;Rolling walker   Functional  mobility during ADLs: Supervision/safety;Min guard;Rolling walker General ADL Comments: Educated that AE is available and discussed toilet aid. Educated on uses of 3 in 1. Recommended sitting for LB bathing.  Discussed use of bag on walker, safe shoewear, cues for upright posture during ambulation. Discussed use of pillows and positioning in chairs at home. Recommended someone be with pt for shower transfer. Educated on use of cups for teeth care and placement of grooming items to avoid breaking precautions.  discussed rugs.      Vision                     Perception     Praxis      Cognition   Behavior During Therapy: [facility] for tasks assessed/performed Overall Cognitive Status: Within Functional Limits for tasks assessed                       Extremity/Trunk Assessment               Exercises     Shoulder Instructions       General Comments      Pertinent Vitals/ Pain       Pain 2-3/10. Pillows behind pt in chair.  Home Living  Prior Functioning/Environment              Frequency Min 2X/week     Progress Toward Goals  OT Goals(current goals can now be found in the care plan section)  Progress towards OT goals: Progressing toward goals  Acute Rehab OT Goals Patient Stated Goal: home today OT Goal Formulation: With patient/family Time For Goal Achievement: 06/23/14 Potential to Achieve Goals: Good ADL Goals Pt Will Perform Grooming: with modified independence;standing Pt Will Perform Lower Body Bathing: with modified independence;sit to/from stand Pt Will Perform Lower Body Dressing: with modified independence;sit to/from stand Pt Will Transfer to Toilet: with modified independence;ambulating;regular height toilet Pt Will Perform Toileting - Clothing Manipulation and hygiene: with supervision;with adaptive equipment;sit to/from stand Pt Will Perform Tub/Shower Transfer: Shower  transfer;with modified independence;ambulating;rolling walker Additional ADL Goal #1: Pt will perform bed mobility using log roll with Supervision to prepare for ADLs. Additional ADL Goal #2: Pt will Independently verbalize 3/3 back precautions.  Plan Discharge plan remains appropriate    Co-evaluation                 End of Session Equipment Utilized During Treatment: Gait belt;Rolling walker   Activity Tolerance Patient tolerated treatment well   Patient Left in chair;with call bell/phone within reach;with family/visitor present   Nurse Communication Other (comment) (needs 3 in 1)        Time: [id number] OT Time Calculation (min): 26 min  Charges: OT General Charges $OT Visit: 1 Procedure OT Treatments $Self Care/Home Management : 23-37 mins    Benito Mccreedy OTR/L [id number] 06/21/2014, 9:34 AM

## 2014-06-21 NOTE — Care Management Note (Signed)
Page 1 of 1   06/21/2014     9:54:22 AM CARE MANAGEMENT NOTE 06/21/2014  Patient:  Rick [name]   Account Number:  [id number]  Date Initiated:  06/16/2014  Documentation initiated by:  ROBARGE,COURTNEY  Subjective/Objective Assessment:   Patient admitted with PLIF. Lives at home with spouse.     Action/Plan:   Will follow for discharge needs pending PT/OT evals and physician orders.   Anticipated DC Date:  06/21/2014   Anticipated DC Plan:  HOME/SELF CARE      DC Planning Services  CM consult      Choice offered to / List presented to:             Status of service:  Completed, signed off Medicare Important Message given?  YES (If response is "NO", the following Medicare IM given date fields will be blank) Date Medicare IM given:  06/18/2014 Medicare IM given by:  ROBARGE,COURTNEY Date Additional Medicare IM given:  06/21/2014 Additional Medicare IM given by:  COURTNEY ROBARGE  Discharge Disposition:  HOME/SELF CARE  Per UR Regulation:  Reviewed for med. necessity/level of care/duration of stay  If discussed at Long Length of Stay Meetings, dates discussed:    Comments:  06/21/14 0945 Courtney Robarge RN, MSN, CM- Per conversation with Dr Botero, no home health services are needed at discharge. Additional  Medicare IM letter was provided. Patient will be discharging home today.   06/18/14 1355 Courtney Robarge RN, MSN, CM- Met with patient to provide Medicare IM letter. Verbalized understanding.   

## 2014-06-21 NOTE — Discharge Summary (Signed)
Physician Discharge Summary  Patient ID: Rick [name] MRN: [id number] DOB/AGE: [birth year] 68 y.o.  Admit date: 06/15/2014 Discharge date: 06/21/2014  Admission Diagnoses:lumbar spondylolisthesis  Discharge Diagnoses:  Active Problems:   Spondylolisthesis of lumbar region   Discharged Condition: no pain  Hospital Course: [facility]  Consults: none  Significant Diagnostic Studies: myelogram  Treatments:lumbar fusion  Discharge Exam: Blood pressure 156/68, pulse 103, temperature 99.3 F (37.4 C), temperature source Oral, resp. rate 18, height 5\' 11"  (1.803 m), weight 227 lb (102.967 kg), SpO2 95.00%. No pain  Disposition: home     Medication List    ASK your doctor about these medications       amLODipine 5 MG tablet  Commonly known as:  NORVASC  Take 5 mg by mouth at bedtime.     HYDROcodone-acetaminophen 7.5-325 MG per tablet  Commonly known as:  NORCO  Take 1 tablet by mouth every 6 (six) hours as needed for moderate pain.     lisinopril 5 MG tablet  Commonly known as:  PRINIVIL,ZESTRIL  Take 5 mg by mouth daily.     metFORMIN 500 MG tablet  Commonly known as:  GLUCOPHAGE  Take 1,000 mg by mouth 2 (two) times daily with a meal.     multivitamin with minerals Tabs tablet  Take 1 tablet by mouth daily.     ranitidine 150 MG tablet  Commonly known as:  ZANTAC  Take 150 mg by mouth 2 (two) times daily.     simvastatin 40 MG tablet  Commonly known as:  ZOCOR  Take 40 mg by mouth at bedtime.     TRULICITY 1.5 0000000 Sopn  Generic drug:  Dulaglutide  Inject 1.5 mg into the skin once a week. Tuesday.         Signed: Floyce Stakes 06/21/2014, 10:00 AM

## 2015-11-21 ENCOUNTER — Other Ambulatory Visit: Payer: Self-pay | Admitting: Internal Medicine

## 2015-11-21 DIAGNOSIS — Z139 Encounter for screening, unspecified: Secondary | ICD-10-CM

## 2015-11-28 ENCOUNTER — Other Ambulatory Visit: Payer: Self-pay | Admitting: Internal Medicine

## 2015-11-28 ENCOUNTER — Ambulatory Visit
Admission: RE | Admit: 2015-11-28 | Discharge: 2015-11-28 | Disposition: A | Discharge status: Home / Self Care | Payer: Self-pay | Source: Ambulatory Visit | Attending: Internal Medicine | Admitting: Internal Medicine

## 2015-11-28 DIAGNOSIS — Z139 Encounter for screening, unspecified: Secondary | ICD-10-CM

## 2016-04-24 ENCOUNTER — Other Ambulatory Visit: Payer: Self-pay | Admitting: Internal Medicine

## 2016-04-24 DIAGNOSIS — D3002 Benign neoplasm of left kidney: Secondary | ICD-10-CM

## 2016-04-25 ENCOUNTER — Other Ambulatory Visit: Payer: Self-pay | Admitting: Internal Medicine

## 2016-04-25 DIAGNOSIS — R945 Abnormal results of liver function studies: Principal | ICD-10-CM

## 2016-04-25 DIAGNOSIS — R7989 Other specified abnormal findings of blood chemistry: Secondary | ICD-10-CM

## 2016-04-27 ENCOUNTER — Other Ambulatory Visit: Payer: Medicare Other

## 2016-04-30 ENCOUNTER — Other Ambulatory Visit: Payer: Medicare Other

## 2016-05-03 ENCOUNTER — Ambulatory Visit
Admission: RE | Admit: 2016-05-03 | Discharge: 2016-05-03 | Disposition: A | Discharge status: Home / Self Care | Payer: Medicare Other | Source: Ambulatory Visit | Attending: Internal Medicine | Admitting: Internal Medicine

## 2016-05-03 DIAGNOSIS — R945 Abnormal results of liver function studies: Principal | ICD-10-CM

## 2016-05-03 DIAGNOSIS — R7989 Other specified abnormal findings of blood chemistry: Secondary | ICD-10-CM

## 2016-05-14 ENCOUNTER — Other Ambulatory Visit: Payer: Self-pay | Admitting: Family Medicine

## 2016-05-14 DIAGNOSIS — R05 Cough: Secondary | ICD-10-CM

## 2016-05-14 DIAGNOSIS — R053 Chronic cough: Secondary | ICD-10-CM

## 2016-05-14 DIAGNOSIS — R0602 Shortness of breath: Secondary | ICD-10-CM

## 2016-05-17 ENCOUNTER — Ambulatory Visit
Admission: RE | Admit: 2016-05-17 | Discharge: 2016-05-17 | Disposition: A | Discharge status: Home / Self Care | Payer: Medicare Other | Source: Ambulatory Visit | Attending: Family Medicine | Admitting: Family Medicine

## 2016-05-17 DIAGNOSIS — R053 Chronic cough: Secondary | ICD-10-CM

## 2016-05-17 DIAGNOSIS — R05 Cough: Secondary | ICD-10-CM

## 2016-05-17 DIAGNOSIS — R0602 Shortness of breath: Secondary | ICD-10-CM

## 2016-05-17 MED ORDER — IOPAMIDOL (ISOVUE-300) INJECTION 61%: 50.0000 mL | Freq: Once | INTRAVENOUS | Status: DC | PRN

## 2016-10-10 ENCOUNTER — Other Ambulatory Visit (HOSPITAL_COMMUNITY): Payer: Self-pay | Admitting: Pulmonary Disease

## 2016-10-10 DIAGNOSIS — R0602 Shortness of breath: Secondary | ICD-10-CM

## 2016-10-18 ENCOUNTER — Ambulatory Visit (HOSPITAL_COMMUNITY)
Admission: RE | Admit: 2016-10-18 | Discharge: 2016-10-18 | Disposition: A | Discharge status: Home / Self Care | Payer: Medicare Other | Source: Ambulatory Visit | Attending: Pulmonary Disease | Admitting: Pulmonary Disease

## 2016-10-18 DIAGNOSIS — Z8701 Personal history of pneumonia (recurrent): Secondary | ICD-10-CM | POA: Diagnosis not present

## 2016-10-18 DIAGNOSIS — Z87891 Personal history of nicotine dependence: Secondary | ICD-10-CM | POA: Diagnosis not present

## 2016-10-18 DIAGNOSIS — I371 Nonrheumatic pulmonary valve insufficiency: Secondary | ICD-10-CM | POA: Insufficient documentation

## 2016-10-18 DIAGNOSIS — R0602 Shortness of breath: Secondary | ICD-10-CM

## 2016-10-18 DIAGNOSIS — M4316 Spondylolisthesis, lumbar region: Secondary | ICD-10-CM | POA: Insufficient documentation

## 2016-10-18 NOTE — Progress Notes (Signed)
*  PRELIMINARY RESULTS* Echocardiogram 2D Echocardiogram has been performed.  Leavy Cella 10/18/2016, 9:11 AM

## 2016-10-25 ENCOUNTER — Other Ambulatory Visit (HOSPITAL_COMMUNITY): Payer: Self-pay | Admitting: Respiratory Therapy

## 2016-10-25 DIAGNOSIS — R0602 Shortness of breath: Secondary | ICD-10-CM

## 2016-11-08 ENCOUNTER — Encounter: Payer: Self-pay | Admitting: Cardiology

## 2016-11-08 NOTE — Progress Notes (Signed)
Cardiology Office Note  Date: 11/09/2016   ID: Rick [name], DOB November 19, 1946, MRN [id number]  PCP: Algie Coffer, MD  Referring provider: Sinda Du, M.D.  Consulting Cardiologist: Rozann Lesches, MD   Chief Complaint  Patient presents with  . Shortness of Breath    History of Present Illness: Rick [name] is a 70 y.o. male referred for cardiology consultation by Dr. Luan Pulling. He presents with a history of recurring shortness of breath, also chest congestion, at times orthopnea. He reports episodes occurring at least since June of this year although states that he has had trouble since he had a "pneumonia shot" about 3 years ago. He tells me that he has been treated with Breo and albuterol most recently, and that this has been actually very effective. He had also previously responded to prednisone.  Recent echocardiogram is outlined below. LVEF 60-65% range without wall motion abnormalities, also evidence of increased LV filling pressures/diastolic dysfunction with mild left atrial enlargement. Question is whether this is causing some of his shortness of breath as well. He is not on a diuretic. He denies leg edema. I reviewed his current medications which include Norvasc and lisinopril.  He is not reporting any exertional chest tightness, generally has NYHA class II dyspnea with his activities. He reports limitations related to chronic recurring back pain.  Past Medical History:  Diagnosis Date  . Arthritis   . Essential hypertension   . Hiatal hernia   . Hyperlipidemia   . Kidney tumor (benign)    Reportedly evaluated at [facility]  . Lateral ventral hernia   . Type 2 diabetes mellitus ([place])     Past Surgical History:  Procedure Laterality Date  . INGUINAL HERNIA REPAIR  1974  . MUSCLE REPAIR     Right shoulder by Dr. Mayer Camel   . REPLACEMENT TOTAL KNEE Left   . TONSILLECTOMY      Current Outpatient Prescriptions  Medication Sig Dispense Refill  . amLODipine  (NORVASC) 5 MG tablet Take 5 mg by mouth at bedtime.    Marland Kitchen HYDROcodone-acetaminophen (NORCO) 10-325 MG tablet Take 1 tablet by mouth 2 (two) times daily as needed.  0  . JANUVIA 100 MG tablet Take 100 mg by mouth daily.  1  . lisinopril (PRINIVIL,ZESTRIL) 20 MG tablet Take 20 mg by mouth daily.  1  . metFORMIN (GLUCOPHAGE) 500 MG tablet Take 1,000 mg by mouth 2 (two) times daily with a meal.    . Multiple Vitamin (MULTIVITAMIN WITH MINERALS) TABS tablet Take 1 tablet by mouth daily.    . ranitidine (ZANTAC) 150 MG tablet Take 150 mg by mouth 2 (two) times daily.    . simvastatin (ZOCOR) 40 MG tablet Take 40 mg by mouth at bedtime.    . hydrochlorothiazide (HYDRODIURIL) 25 MG tablet Take 1 tablet (25 mg total) by mouth daily. 90 tablet 3   No current facility-administered medications for this visit.    Allergies:  Dilaudid [hydromorphone hcl]   Social History: The patient  reports that he quit smoking about 17 years ago. His smoking use included Cigarettes. He has a 45.00 pack-year smoking history. He has never used smokeless tobacco. He reports that he drinks about 14.4 oz of alcohol per week . He reports that he does not use drugs.   Family History: The patient's family history includes Stroke in his mother.   ROS:  Please see the history of present illness. Otherwise, complete review of systems is positive for chronic back pain.  All other systems are reviewed and negative.   Physical Exam: VS:  BP (!) 158/78   Pulse 85   Ht 5\' 11"  (1.803 m)   Wt 239 lb (108.4 kg)   SpO2 98%   BMI 33.33 kg/m , BMI Body mass index is 33.33 kg/m.  Wt Readings from Last 3 Encounters:  11/09/16 239 lb (108.4 kg)  06/15/14 227 lb (103 kg)  06/08/14 227 lb 11.8 oz (103.3 kg)    General: Obese male, appears comfortable at rest. HEENT: Conjunctiva and lids normal, oropharynx clear. Neck: Supple, no elevated JVP or carotid bruits, no thyromegaly. Lungs: Clear to auscultation, nonlabored breathing at  rest. Cardiac: Regular rate and rhythm, S4, no significant systolic murmur, no pericardial rub. Abdomen: Soft, nontender, bowel sounds present, no guarding or rebound. Extremities: No pitting edema, distal pulses 2+. Skin: Warm and dry. Musculoskeletal: No kyphosis. Neuropsychiatric: Alert and oriented x3, affect grossly appropriate.  ECG: I personally reviewed the tracing from 06/08/2014 which showed normal sinus rhythm with R' in lead V1 and V2.  Recent Labwork:  July 2015: Potassium 5.0, BUN 17, creatinine 1.2, hemoglobin 10.7, platelets 194  Other Studies Reviewed Today:  Echocardiogram 10/18/2016: Study Conclusions  - Left ventricle: The cavity size was normal. Systolic function was   normal. The estimated ejection fraction was in the range of 60%   to 65%. Wall motion was normal; there were no regional wall   motion abnormalities. Doppler parameters are consistent with both   elevated ventricular end-diastolic filling pressure and elevated   left atrial filling pressure. - Left atrium: The atrium was mildly dilated. - Atrial septum: No defect or patent foramen ovale was identified.  Assessment and Plan:  1. Shortness of breath and chest congestion, likely multifactorial. He really does seem to describe a pulmonary component and relates significant improvement in symptoms with use of albuterol and Breo. Diastolic dysfunction may also be playing a role. We will add HCTZ 25 mg daily to his current regimen, follow-up BMET in 2 weeks, then see him back in a month for reassessment.  2. Obesity, weight loss would also be beneficial. He states that he has difficulty walking for exercise due to chronic back pain.  3. Essential hypertension, on Norvasc and lisinopril.  4. Hyperlipidemia, on Zocor.  Current medicines were reviewed with the patient today.   Orders Placed This Encounter  Procedures  . Basic Metabolic Panel (BMET)  . EKG 12-Lead    Disposition: Follow-up in one  month.  Signed, Satira Sark, MD, [facility] 11/09/2016 8:50 AM    [place] [facility] [address], [place], [place] [address] Phone: [phone]; Fax: [phone]

## 2016-11-09 ENCOUNTER — Ambulatory Visit (INDEPENDENT_AMBULATORY_CARE_PROVIDER_SITE_OTHER): Payer: Medicare Other | Admitting: Cardiology

## 2016-11-09 ENCOUNTER — Encounter: Payer: Self-pay | Admitting: Cardiology

## 2016-11-09 VITALS — BP 158/78 | HR 85 | Ht 71.0 in | Wt 239.0 lb

## 2016-11-09 DIAGNOSIS — E669 Obesity, unspecified: Secondary | ICD-10-CM | POA: Diagnosis not present

## 2016-11-09 DIAGNOSIS — R0602 Shortness of breath: Secondary | ICD-10-CM | POA: Diagnosis not present

## 2016-11-09 DIAGNOSIS — I519 Heart disease, unspecified: Secondary | ICD-10-CM

## 2016-11-09 DIAGNOSIS — I5189 Other ill-defined heart diseases: Secondary | ICD-10-CM

## 2016-11-09 DIAGNOSIS — I10 Essential (primary) hypertension: Secondary | ICD-10-CM

## 2016-11-09 DIAGNOSIS — E782 Mixed hyperlipidemia: Secondary | ICD-10-CM | POA: Diagnosis not present

## 2016-11-09 DIAGNOSIS — E66811 Obesity, class 1: Secondary | ICD-10-CM

## 2016-11-09 MED ORDER — HYDROCHLOROTHIAZIDE 25 MG PO TABS: 25.0000 mg | ORAL_TABLET | Freq: Every day | ORAL | 3 refills | Status: DC

## 2016-11-09 NOTE — Patient Instructions (Addendum)
Your physician recommends that you schedule a follow-up appointment in:  1 month Dr Domenic Polite   START HCTZ 25 mg every morning    Get lab work in 2 weeks     Thank you for choosing [facility] !

## 2016-12-17 ENCOUNTER — Encounter: Payer: Self-pay | Admitting: Cardiology

## 2016-12-17 ENCOUNTER — Ambulatory Visit (INDEPENDENT_AMBULATORY_CARE_PROVIDER_SITE_OTHER): Payer: Medicare Other | Admitting: Cardiology

## 2016-12-17 VITALS — BP 128/78 | HR 84 | Ht 71.0 in | Wt 239.0 lb

## 2016-12-17 DIAGNOSIS — I519 Heart disease, unspecified: Secondary | ICD-10-CM | POA: Diagnosis not present

## 2016-12-17 DIAGNOSIS — I10 Essential (primary) hypertension: Secondary | ICD-10-CM | POA: Diagnosis not present

## 2016-12-17 DIAGNOSIS — I5189 Other ill-defined heart diseases: Secondary | ICD-10-CM

## 2016-12-17 NOTE — Progress Notes (Signed)
Cardiology Office Note  Date: 12/17/2016   ID: Rick [name], DOB 1946/03/14, MRN [id number]  PCP: Ferrie Ranch, MD  Primary Cardiologist: Rozann Lesches, MD   Chief Complaint  Patient presents with  . Follow-up shortness of breath    History of Present Illness: Rick [name] is a 71 y.o. male seen in consultation in December 2017. He presents today for a follow-up visit. States that he has been doing well overall, shortness of breath has improved, he attributes a lot of this to his inhaler use.  At the last visit HCTZ was added in the setting of diastolic dysfunction. Lab work in December 2017 is reviewed below. He has tolerated this addition well. Systolic blood pressure is in the 120s today.  Past Medical History:  Diagnosis Date  . Arthritis   . Essential hypertension   . Hiatal hernia   . Hyperlipidemia   . Kidney tumor (benign)    Reportedly evaluated at [facility]  . Lateral ventral hernia   . Type 2 diabetes mellitus ([place])     Current Outpatient Prescriptions  Medication Sig Dispense Refill  . amLODipine (NORVASC) 5 MG tablet Take 5 mg by mouth at bedtime.    Marland Kitchen glimepiride (AMARYL) 1 MG tablet Take 1 mg by mouth daily.  1  . hydrochlorothiazide (HYDRODIURIL) 25 MG tablet Take 1 tablet (25 mg total) by mouth daily. 90 tablet 3  . HYDROcodone-acetaminophen (NORCO) 10-325 MG tablet Take 1 tablet by mouth 2 (two) times daily as needed.  0  . JANUVIA 100 MG tablet Take 100 mg by mouth daily.  1  . lisinopril (PRINIVIL,ZESTRIL) 20 MG tablet Take 20 mg by mouth daily.  1  . metFORMIN (GLUCOPHAGE) 500 MG tablet Take 1,000 mg by mouth 2 (two) times daily with a meal.    . Multiple Vitamin (MULTIVITAMIN WITH MINERALS) TABS tablet Take 1 tablet by mouth daily.    . ranitidine (ZANTAC) 150 MG tablet Take 150 mg by mouth 2 (two) times daily.    . simvastatin (ZOCOR) 40 MG tablet Take 40 mg by mouth at bedtime.     No current facility-administered medications for  this visit.    Allergies:  Patient has no known allergies.   Social History: The patient  reports that he quit smoking about 17 years ago. His smoking use included Cigarettes. He has a 45.00 pack-year smoking history. He has never used smokeless tobacco. He reports that he drinks about 14.4 oz of alcohol per week . He reports that he does not use drugs.   ROS:  Please see the history of present illness. Otherwise, complete review of systems is positive for none.  All other systems are reviewed and negative.   Physical Exam: VS:  BP 128/78   Pulse 84   Ht 5\' 11"  (1.803 m)   Wt 239 lb (108.4 kg)   SpO2 95%   BMI 33.33 kg/m , BMI Body mass index is 33.33 kg/m.  Wt Readings from Last 3 Encounters:  12/17/16 239 lb (108.4 kg)  11/09/16 239 lb (108.4 kg)  06/15/14 227 lb (103 kg)    General: Obese male, appears comfortable at rest. HEENT: Conjunctiva and lids normal, oropharynx clear. Neck: Supple, no elevated JVP or carotid bruits, no thyromegaly. Lungs: Clear to auscultation, nonlabored breathing at rest. Cardiac: Regular rate and rhythm, S4, no significant systolic murmur, no pericardial rub. Abdomen: Soft, nontender, bowel sounds present, no guarding or rebound. Extremities: No pitting edema, distal pulses 2+.  ECG: I personally reviewed the tracing from 11/09/2016 which showed normal sinus rhythm.  Recent Labwork:  December 2017: BUN 17, creatinine 1.3, potassium 4.8  Other Studies Reviewed Today:  Echocardiogram 10/18/2016: Study Conclusions  - Left ventricle: The cavity size was normal. Systolic function was   normal. The estimated ejection fraction was in the range of 60%   to 65%. Wall motion was normal; there were no regional wall   motion abnormalities. Doppler parameters are consistent with both   elevated ventricular end-diastolic filling pressure and elevated   left atrial filling pressure. - Left atrium: The atrium was mildly dilated. - Atrial septum: No  defect or patent foramen ovale was identified.  Assessment and Plan:  1. Multifactorial shortness of breath, improved. From the perspective of diastolic dysfunction, would continue with current treatment including the addition of HCTZ. He is on lisinopril and Norvasc as well. No further cardiac testing planned at this time unless symptoms worsen. Keep follow-up with PCP.  2. Essential hypertension, no changes made in current regimen.  Current medicines were reviewed with the patient today.   Disposition: Follow-up as needed.  Signed, Satira Sark, MD, [facility] 12/17/2016 9:16 AM    [place] at [place]. [address], [place], [place] [address] Phone: [phone]; Fax: [phone]

## 2016-12-17 NOTE — Patient Instructions (Signed)
Your physician recommends that you schedule a follow-up appointment in: as needed     Your physician recommends that you continue on your current medications as directed. Please refer to the Current Medication list given to you today.   Thank you for choosing [facility] !         

## 2017-12-16 ENCOUNTER — Other Ambulatory Visit: Payer: Self-pay | Admitting: Pulmonary Disease

## 2017-12-17 ENCOUNTER — Other Ambulatory Visit: Payer: Self-pay | Admitting: Pulmonary Disease

## 2017-12-17 DIAGNOSIS — Z905 Acquired absence of kidney: Secondary | ICD-10-CM

## 2017-12-18 LAB — PSA: PSA: 0.7

## 2017-12-18 LAB — HEMOGLOBIN A1C: Hemoglobin A1C: 6.7

## 2017-12-26 ENCOUNTER — Ambulatory Visit
Admission: RE | Admit: 2017-12-26 | Discharge: 2017-12-26 | Disposition: A | Discharge status: Home / Self Care | Payer: Medicare Other | Source: Ambulatory Visit | Attending: Pulmonary Disease | Admitting: Pulmonary Disease

## 2017-12-26 DIAGNOSIS — Z905 Acquired absence of kidney: Secondary | ICD-10-CM

## 2019-01-09 ENCOUNTER — Encounter: Payer: Self-pay | Admitting: Family Medicine

## 2019-07-14 LAB — BASIC METABOLIC PANEL
BUN: 24 — AB (ref 4–21)
CO2: 30 — AB (ref 13–22)
Chloride: 103 (ref 99–108)
Creatinine: 1.7 — AB (ref <=1.3)
Glucose: 151
Potassium: 4.6 (ref 3.4–5.3)
Sodium: 140 (ref 137–147)

## 2019-07-14 LAB — COMPREHENSIVE METABOLIC PANEL
Albumin: 4.1 (ref 3.5–5.0)
Calcium: 9.8 (ref 8.7–10.7)
GFR calc Af Amer: 45
GFR calc non Af Amer: 39
Globulin: 2.9

## 2019-07-14 LAB — CBC AND DIFFERENTIAL
HCT: 36 — AB (ref 41–53)
Hemoglobin: 12.4 — AB (ref 13.5–17.5)
Neutrophils Absolute: 5040
Platelets: 257 (ref 150–399)
WBC: 7

## 2019-07-14 LAB — HEPATIC FUNCTION PANEL
ALT: 114 — AB (ref 10–40)
AST: 48 — AB (ref 14–40)
Alkaline Phosphatase: 56 (ref 25–125)
Bilirubin, Total: 0.9

## 2019-07-14 LAB — HEMOGLOBIN A1C: Hemoglobin A1C: 7.2

## 2019-07-14 LAB — CBC: RBC: 4.06 (ref 3.87–5.11)

## 2019-09-09 ENCOUNTER — Other Ambulatory Visit: Payer: Self-pay | Admitting: Pulmonary Disease

## 2019-09-09 DIAGNOSIS — R911 Solitary pulmonary nodule: Secondary | ICD-10-CM

## 2019-09-10 ENCOUNTER — Other Ambulatory Visit: Payer: Self-pay | Admitting: Pulmonary Disease

## 2019-09-15 ENCOUNTER — Other Ambulatory Visit: Payer: Self-pay | Admitting: Pulmonary Disease

## 2019-09-15 DIAGNOSIS — R911 Solitary pulmonary nodule: Secondary | ICD-10-CM

## 2019-09-16 ENCOUNTER — Ambulatory Visit
Admission: RE | Admit: 2019-09-16 | Discharge: 2019-09-16 | Disposition: A | Discharge status: Home / Self Care | Payer: Medicare Other | Source: Ambulatory Visit | Attending: Pulmonary Disease | Admitting: Pulmonary Disease

## 2019-09-16 ENCOUNTER — Other Ambulatory Visit: Payer: Medicare Other

## 2019-09-16 DIAGNOSIS — R911 Solitary pulmonary nodule: Secondary | ICD-10-CM

## 2019-11-12 ENCOUNTER — Other Ambulatory Visit: Payer: Self-pay

## 2019-11-12 ENCOUNTER — Encounter: Payer: Self-pay | Admitting: Cardiology

## 2019-11-12 ENCOUNTER — Ambulatory Visit: Payer: Medicare Other | Admitting: Cardiology

## 2019-11-12 VITALS — BP 158/77 | HR 104 | Temp 97.6°F | Ht 71.0 in | Wt 247.0 lb

## 2019-11-12 DIAGNOSIS — I499 Cardiac arrhythmia, unspecified: Secondary | ICD-10-CM | POA: Diagnosis not present

## 2019-11-12 DIAGNOSIS — I2510 Atherosclerotic heart disease of native coronary artery without angina pectoris: Secondary | ICD-10-CM | POA: Diagnosis not present

## 2019-11-12 DIAGNOSIS — E1165 Type 2 diabetes mellitus with hyperglycemia: Secondary | ICD-10-CM

## 2019-11-12 DIAGNOSIS — I471 Supraventricular tachycardia: Secondary | ICD-10-CM

## 2019-11-12 DIAGNOSIS — I2584 Coronary atherosclerosis due to calcified coronary lesion: Secondary | ICD-10-CM

## 2019-11-12 MED ORDER — DILTIAZEM HCL ER COATED BEADS 180 MG PO CP24: 180.0000 mg | ORAL_CAPSULE | Freq: Every day | ORAL | 3 refills | Status: DC

## 2019-11-12 NOTE — Progress Notes (Signed)
Cardiology Office Note  Date: 11/12/2019   ID: Yair, Dusza [birth year], MRN [id number]  PCP:  Sinda Du, MD  Cardiologist: Satira Sark, MD Electrophysiologist:  None   Chief Complaint  Patient presents with  . Follow-up visit    History of Present Illness: Rick [name] is a 72 y.o. male last seen in January 2018.  He is referred back to the office by Dr. Luan Pulling.  From a symptom perspective, Rick [name] does not report any worsening shortness of breath, has done better on current regimen including Trelegy.  He has had no exertional chest pain or sense of palpitations.  He underwent a chest CT back in October for follow-up of pulmonary nodule. This study noted incidental vascular calcification including coronaries and aorta.  I reviewed his most recent lab work per Dr. Luan Pulling in November.  Hemoglobin A1c was 7% which was higher than prior.  He has evidence of CKD stage III as well with creatinine 1.73.  He has not had recent lipids.  I went over over his medications which are outlined below.  On examination heart rate was found to be intermittently rapid and irregular.  ECG obtained showing sinus rhythm with frequent PACs and pattern of bigeminy and also with a run of atrial tachycardia.  Past Medical History:  Diagnosis Date  . Arthritis   . Essential hypertension   . Hiatal hernia   . Hyperlipidemia   . Kidney tumor (benign)    Reportedly evaluated at [facility]  . Lateral ventral hernia   . Type 2 diabetes mellitus ([place])     Past Surgical History:  Procedure Laterality Date  . INGUINAL HERNIA REPAIR  1974  . MUSCLE REPAIR     Right shoulder by Dr. Mayer Camel   . REPLACEMENT TOTAL KNEE Left   . TONSILLECTOMY      Current Outpatient Medications  Medication Sig Dispense Refill  . aspirin EC 81 MG tablet Take 81 mg by mouth daily.    Marland Kitchen atorvastatin (LIPITOR) 40 MG tablet Take 40 mg by mouth daily.    Marland Kitchen diltiazem (CARDIZEM CD) 180 MG 24 hr capsule Take  1 capsule (180 mg total) by mouth daily. 90 capsule 3  . glimepiride (AMARYL) 1 MG tablet Take 1 mg by mouth daily.  1  . HYDROcodone-acetaminophen (NORCO) 10-325 MG tablet Take 1 tablet by mouth 2 (two) times daily as needed.  0  . JANUVIA 100 MG tablet Take 100 mg by mouth daily.  1  . lisinopril-hydrochlorothiazide (ZESTORETIC) 20-12.5 MG tablet Take 1 tablet by mouth daily.    Marland Kitchen losartan (COZAAR) 100 MG tablet Take by mouth.    . metFORMIN (GLUCOPHAGE) 500 MG tablet Take 1,000 mg by mouth 2 (two) times daily with a meal.    . Multiple Vitamin (MULTIVITAMIN WITH MINERALS) TABS tablet Take 1 tablet by mouth daily.    Viviana Simpler ELLIPTA 100-62.5-25 MCG/INH AEPB SMARTSIG:1 Inhalation Via Inhaler Daily     No current facility-administered medications for this visit.    Allergies:  Patient has no known allergies.   Social History: The patient  reports that he quit smoking about 20 years ago. His smoking use included cigarettes. He has a 45.00 pack-year smoking history. He has never used smokeless tobacco. He reports current alcohol use of about 24.0 standard drinks of alcohol per week. He reports that he does not use drugs.   Family History: The patient's family history includes Heart attack in his father, maternal grandmother,  paternal grandfather, and paternal grandmother; Heart failure in his maternal grandfather; Stroke in his mother.   ROS:  Please see the history of present illness. Otherwise, complete review of systems is positive for chronic back pain.  All other systems are reviewed and negative.   Physical Exam: VS:  BP (!) 158/77   Pulse (!) 104   Temp 97.6 F (36.4 C)   Ht 5\' 11"  (1.803 m)   Wt 247 lb (112 kg)   SpO2 98%   BMI 34.45 kg/m , BMI Body mass index is 34.45 kg/m.  Wt Readings from Last 3 Encounters:  11/12/19 247 lb (112 kg)  12/17/16 239 lb (108.4 kg)  11/09/16 239 lb (108.4 kg)    General: Patient appears comfortable at rest. HEENT: Conjunctiva and lids  normal, wearing a mask. Neck: Supple, no elevated JVP or carotid bruits, no thyromegaly. Lungs: No active wheezing, nonlabored breathing at rest. Cardiac: Irregular and intermittently rapid, no S3 or significant systolic murmur. Abdomen: Obese, nontender, bowel sounds present. Extremities: No pitting edema, distal pulses 2+. Skin: Warm and dry. Musculoskeletal: No kyphosis. Neuropsychiatric: Alert and oriented x3, affect grossly appropriate.  ECG:  An ECG dated 11/09/2016 was personally reviewed today and demonstrated:  Normal sinus rhythm.  Recent Labwork:  December 2017: BUN 17, creatinine 1.3, potassium 4.8  Other Studies Reviewed Today:  Echocardiogram 10/18/2016: Study Conclusions  - Left ventricle: The cavity size was normal. Systolic function was normal. The estimated ejection fraction was in the range of 60% to 65%. Wall motion was normal; there were no regional wall motion abnormalities. Doppler parameters are consistent with both elevated ventricular end-diastolic filling pressure and elevated left atrial filling pressure. - Left atrium: The atrium was mildly dilated. - Atrial septum: No defect or patent foramen ovale was identified.  Assessment and Plan:  1.  Incidentally noted coronary and aortic calcifications by CT imaging in October.  He does not report active angina at this time.  ECG shows no pathologic Q waves.  At this point would recommend medical therapy and risk factor modification.  Starting aspirin 81 mg daily.  Continue statin therapy at current dose with follow-up FLP and LFTs.  Continue ARB.  2.  PACs and atrial tachycardia by ECG.  He does not report any palpitations. Plan to switch from Norvasc to Cardizem CD 180 mg daily.  Continue observation.  3.  Uncontrolled type 2 diabetes mellitus.  Recent hemoglobin A1c 7%.  He is currently on Amaryl, Metformin and Januvia with follow-up by Dr. Luan Pulling.  4.  Tiny, pulmonary nodules up to 4 mm,  unchanged since 2017 and likely benign.  Medication Adjustments/Labs and Tests Ordered: Current medicines are reviewed at length with the patient today.  Concerns regarding medicines are outlined above.   Tests Ordered: Orders Placed This Encounter  Procedures  . Lipid Profile  . Hepatic function panel  . EKG 12-Lead    Medication Changes: Meds ordered this encounter  Medications  . diltiazem (CARDIZEM CD) 180 MG 24 hr capsule    Sig: Take 1 capsule (180 mg total) by mouth daily.    Dispense:  90 capsule    Refill:  3    Disposition:  Follow up 3 months in the [place] office.  Signed, Satira Sark, MD, [facility] 11/12/2019 2:52 PM    [place] at [facility] [address], [place], [place] [address] Phone: [phone]; Fax: [phone]

## 2019-11-12 NOTE — Patient Instructions (Signed)
Medication Instructions:  STOP Amlodipine   START Cardizem CD 180 mg daily   START Aspirin 81 mg daily   *If you need a refill on your cardiac medications before your next appointment, please call your pharmacy*  Lab Work: Fasting Lipids and LFT's  If you have labs (blood work) drawn today and your tests are completely normal, you will receive your results only by: Marland Kitchen MyChart Message (if you have MyChart) OR . A paper copy in the mail If you have any lab test that is abnormal or we need to change your treatment, we will call you to review the results.  Testing/Procedures: None today  Follow-Up: At [facility], you and your health needs are our priority.  As part of our continuing mission to provide you with exceptional heart care, we have created designated Provider Care Teams.  These Care Teams include your primary Cardiologist (physician) and Advanced Practice Providers (APPs -  Physician Assistants and Nurse Practitioners) who all work together to provide you with the care you need, when you need it.  Your next appointment:   3 month(s)  The format for your next appointment:   In Person  Provider:   Rozann Lesches, MD  Other Instructions None      Thank you for choosing [facility] !

## 2019-11-19 ENCOUNTER — Telehealth: Payer: Self-pay

## 2019-11-19 ENCOUNTER — Other Ambulatory Visit (HOSPITAL_COMMUNITY)
Admission: RE | Admit: 2019-11-19 | Discharge: 2019-11-19 | Disposition: A | Discharge status: Home / Self Care | Payer: Medicare Other | Source: Ambulatory Visit | Attending: Cardiology | Admitting: Cardiology

## 2019-11-19 DIAGNOSIS — I499 Cardiac arrhythmia, unspecified: Secondary | ICD-10-CM | POA: Insufficient documentation

## 2019-11-19 DIAGNOSIS — R7401 Elevation of levels of liver transaminase levels: Secondary | ICD-10-CM | POA: Diagnosis not present

## 2019-11-19 LAB — HEPATIC FUNCTION PANEL
ALT: 129 U/L — ABNORMAL HIGH (ref 0–44)
AST: 54 U/L — ABNORMAL HIGH (ref 15–41)
Albumin: 3.7 g/dL (ref 3.5–5.0)
Alkaline Phosphatase: 54 U/L (ref 38–126)
Bilirubin, Direct: 0.1 mg/dL (ref 0.0–0.2)
Indirect Bilirubin: 0.4 mg/dL (ref 0.3–0.9)
Total Bilirubin: 0.5 mg/dL (ref 0.3–1.2)
Total Protein: 7.5 g/dL (ref 6.5–8.1)

## 2019-11-19 LAB — LIPID PANEL
Cholesterol: 169 mg/dL (ref 0–200)
HDL: 40 mg/dL — ABNORMAL LOW (ref >40)
LDL Cholesterol: 109 mg/dL — ABNORMAL HIGH (ref 0–99)
Total CHOL/HDL Ratio: 4.2 RATIO
Triglycerides: 102 mg/dL (ref <150)
VLDL: 20 mg/dL (ref 0–40)

## 2019-11-19 MED ORDER — ATORVASTATIN CALCIUM 40 MG PO TABS: 40.0000 mg | ORAL_TABLET | Freq: Every day | ORAL | 0 refills | Status: DC

## 2019-11-19 NOTE — Telephone Encounter (Signed)
-----   Message from Merlene Laughter, RN sent at 11/19/2019  1:06 PM EST -----  ----- Message ----- From: Satira Sark, MD Sent: 11/19/2019   1:04 PM EST To: Merlene Laughter, RN  Results reviewed.  LFTs are abnormal with AST 54 and ALT 129 (higher than last available check from 5 years ago in our system).  I do not know how this compares to lab work obtained by Dr. Luan Pulling in the meanwhile.  LDL is 109 which is not at goal for coronary atherosclerosis.  Would suggest holding Lipitor at this time, get last LFTs from Dr. Luan Pulling office.  Recheck LFTs in 6 weeks.

## 2019-11-19 NOTE — Telephone Encounter (Signed)
Left message at [place] office asking for recent lft's. Patient had results of lft's from 07/14/2019: AST 48, ALT 114. He is going to look to see if he has any results that are more recent. Meanwhile, he will hold lipitor.I  will mail lab slip to him

## 2019-12-10 DIAGNOSIS — N529 Male erectile dysfunction, unspecified: Secondary | ICD-10-CM | POA: Insufficient documentation

## 2019-12-10 DIAGNOSIS — E669 Obesity, unspecified: Secondary | ICD-10-CM | POA: Insufficient documentation

## 2019-12-10 DIAGNOSIS — J449 Chronic obstructive pulmonary disease, unspecified: Secondary | ICD-10-CM | POA: Insufficient documentation

## 2019-12-10 DIAGNOSIS — E1121 Type 2 diabetes mellitus with diabetic nephropathy: Secondary | ICD-10-CM | POA: Insufficient documentation

## 2019-12-10 DIAGNOSIS — E7800 Pure hypercholesterolemia, unspecified: Secondary | ICD-10-CM | POA: Insufficient documentation

## 2019-12-10 DIAGNOSIS — I700 Atherosclerosis of aorta: Secondary | ICD-10-CM

## 2019-12-10 DIAGNOSIS — I10 Essential (primary) hypertension: Secondary | ICD-10-CM | POA: Insufficient documentation

## 2019-12-10 DIAGNOSIS — E119 Type 2 diabetes mellitus without complications: Secondary | ICD-10-CM | POA: Insufficient documentation

## 2019-12-10 DIAGNOSIS — R0602 Shortness of breath: Secondary | ICD-10-CM | POA: Insufficient documentation

## 2019-12-10 DIAGNOSIS — R05 Cough: Secondary | ICD-10-CM

## 2019-12-10 DIAGNOSIS — R059 Cough, unspecified: Secondary | ICD-10-CM | POA: Insufficient documentation

## 2019-12-10 DIAGNOSIS — E1169 Type 2 diabetes mellitus with other specified complication: Secondary | ICD-10-CM | POA: Insufficient documentation

## 2019-12-10 DIAGNOSIS — N184 Chronic kidney disease, stage 4 (severe): Secondary | ICD-10-CM | POA: Insufficient documentation

## 2019-12-10 DIAGNOSIS — I129 Hypertensive chronic kidney disease with stage 1 through stage 4 chronic kidney disease, or unspecified chronic kidney disease: Secondary | ICD-10-CM

## 2019-12-31 ENCOUNTER — Telehealth: Payer: Self-pay

## 2019-12-31 ENCOUNTER — Other Ambulatory Visit (HOSPITAL_COMMUNITY)
Admission: RE | Admit: 2019-12-31 | Discharge: 2019-12-31 | Disposition: A | Discharge status: Home / Self Care | Payer: Medicare Other | Source: Ambulatory Visit | Attending: Cardiology | Admitting: Cardiology

## 2019-12-31 DIAGNOSIS — R7401 Elevation of levels of liver transaminase levels: Secondary | ICD-10-CM | POA: Insufficient documentation

## 2019-12-31 LAB — HEPATIC FUNCTION PANEL
ALT: 135 U/L — ABNORMAL HIGH (ref 0–44)
AST: 63 U/L — ABNORMAL HIGH (ref 15–41)
Albumin: 3.7 g/dL (ref 3.5–5.0)
Alkaline Phosphatase: 54 U/L (ref 38–126)
Bilirubin, Direct: 0.1 mg/dL (ref 0.0–0.2)
Indirect Bilirubin: 0.8 mg/dL (ref 0.3–0.9)
Total Bilirubin: 0.9 mg/dL (ref 0.3–1.2)
Total Protein: 7.5 g/dL (ref 6.5–8.1)

## 2019-12-31 MED ORDER — ATORVASTATIN CALCIUM 40 MG PO TABS: 20.0000 mg | ORAL_TABLET | Freq: Every day | ORAL | 0 refills | Status: DC

## 2019-12-31 NOTE — Telephone Encounter (Signed)
LFT results given to patient.He declines to see GI. Said "I am not worried about it" I told him to call me if he changes his mind.    I will FYI Dr.McDowell

## 2019-12-31 NOTE — Telephone Encounter (Signed)
-----   Message from Satira Sark, MD sent at 12/31/2019 10:19 AM EST ----- Results reviewed.  LFTs remain abnormal, AST 63 and ALT 135.  Statin therapy is not new, and these elevations have been longer-term based on review of serial lab work.  He had been following with Dr. Luan Pulling who is now retired.  Please see if he would be amenable to a GI consultation to investigate this further.  In the meanwhile would reduce Lipitor to 20 mg daily.

## 2020-02-10 ENCOUNTER — Encounter: Payer: Self-pay | Admitting: Cardiology

## 2020-02-10 NOTE — Progress Notes (Signed)
Cardiology Office Note  Date: 02/11/2020   ID: [name], DOB Jul 28, 1946, MRN [id number]  PCP:  Asencion Noble, MD  Cardiologist: Satira Sark, MD Electrophysiologist:  None   Chief Complaint  Patient presents with  . Cardiac follow-up    History of Present Illness: Rick [name] is a 74 y.o. male last seen in December 2020.  He is here for a routine visit today.  States that he now has a pulse oximeter, has been checking his heart rate intermittently, still has occasional irregularity but feels like this has settled down since medications have been adjusted.  He does not report any chest pain or breathlessness.  In the last visit he was switched from Norvasc to Cardizem CD.  ECG at that time showed PACs and burst of atrial tachycardia.  Fasting lipid profile with LFTs were obtained with LDL 109 on Lipitor, AST was 54 and ALT 129.  Lipitor was subsequently held and follow-up AST was 63 with ALT 135.  Increased LFTs looks to be longer standing based on review of serial lab work.  It was recommended that he consider a GI consultation however he declined.  He is back on Lipitor 40 mg daily, now seeing Dr. Willey Blade for PCP.  He tells me that he was drinking 3-5 beers daily, has done so for quite a while.  He has cut this back to for a week.  He will have follow-up lab work with Dr. Willey Blade in April.  Past Medical History:  Diagnosis Date  . Arthritis   . Atherosclerosis of aorta ([place])   . Chronic obstructive pulmonary disease, unspecified ([place])   . CKD (chronic kidney disease) stage 3, GFR 30-59 ml/min   . Essential hypertension   . Hiatal hernia   . Hyperlipidemia   . Kidney tumor (benign)    Reportedly evaluated at [facility]  . Lateral ventral hernia   . Low back pain   . Male erectile disorder   . Type 2 diabetes mellitus ([place])     Past Surgical History:  Procedure Laterality Date  . BACK SURGERY    . INGUINAL HERNIA REPAIR  1974  . MUSCLE REPAIR     Right shoulder by  Dr. Mayer Camel   . REPLACEMENT TOTAL KNEE Left   . TONSILLECTOMY      Current Outpatient Medications  Medication Sig Dispense Refill  . aspirin EC 81 MG tablet Take 81 mg by mouth daily.    Marland Kitchen atorvastatin (LIPITOR) 40 MG tablet Take 40 mg by mouth daily.    Marland Kitchen diltiazem (CARDIZEM CD) 180 MG 24 hr capsule Take 1 capsule (180 mg total) by mouth daily. 90 capsule 3  . glimepiride (AMARYL) 1 MG tablet Take 1 mg by mouth daily.  1  . HYDROcodone-acetaminophen (NORCO) 10-325 MG tablet Take 1 tablet by mouth 2 (two) times daily as needed.  0  . JANUVIA 100 MG tablet Take 100 mg by mouth daily.  1  . lisinopril-hydrochlorothiazide (ZESTORETIC) 20-12.5 MG tablet Take 1 tablet by mouth daily.    . metFORMIN (GLUCOPHAGE) 500 MG tablet Take 1,000 mg by mouth 2 (two) times daily with a meal.    . Multiple Vitamin (MULTIVITAMIN WITH MINERALS) TABS tablet Take 1 tablet by mouth daily.    . sildenafil (REVATIO) 20 MG tablet Take 20-100 mg by mouth as directed.    Viviana Simpler ELLIPTA 100-62.5-25 MCG/INH AEPB SMARTSIG:1 Inhalation Via Inhaler Daily     No current facility-administered medications for this  visit.   Allergies:  Patient has no known allergies.   ROS:  No dizziness or syncope.  Physical Exam: VS:  BP (!) 142/76   Pulse 86   Temp 99.1 F (37.3 C)   Ht 5\' 11"  (1.803 m)   Wt 242 lb (109.8 kg)   SpO2 94%   BMI 33.75 kg/m , BMI Body mass index is 33.75 kg/m.  Wt Readings from Last 3 Encounters:  02/11/20 242 lb (109.8 kg)  11/12/19 247 lb (112 kg)  12/17/16 239 lb (108.4 kg)    General: Patient appears comfortable at rest. HEENT: Conjunctiva and lids normal, wearing a mask. Neck: Supple, no elevated JVP or carotid bruits, no thyromegaly. Lungs: Decreased breath sounds without wheezing, nonlabored breathing at rest. Cardiac: Regular rate and rhythm, no S3 or significant systolic murmur. Abdomen: Soft, nontender, bowel sounds present.  Extremities: No pitting edema, distal pulses  2+.  ECG:  An ECG dated 11/29/2019 was personally reviewed today and demonstrated:  Sinus rhythm with PACs and run of atrial tachycardia.  Recent Labwork: 07/14/2019: BUN 24; Creatinine 1.7; Hemoglobin 12.4; Platelets 257; Potassium 4.6; Sodium 140 12/31/2019: ALT 135; AST 63     Component Value Date/Time   CHOL 169 11/19/2019 0854   TRIG 102 11/19/2019 0854   HDL 40 (L) 11/19/2019 0854   CHOLHDL 4.2 11/19/2019 0854   VLDL 20 11/19/2019 0854   LDLCALC 109 (H) 11/19/2019 0854    Other Studies Reviewed Today:  Echocardiogram 10/18/2016: Study Conclusions  - Left ventricle: The cavity size was normal. Systolic function was normal. The estimated ejection fraction was in the range of 60% to 65%. Wall motion was normal; there were no regional wall motion abnormalities. Doppler parameters are consistent with both elevated ventricular end-diastolic filling pressure and elevated left atrial filling pressure. - Left atrium: The atrium was mildly dilated. - Atrial septum: No defect or patent foramen ovale was identified.  Assessment and Plan:  1.  PACs and intermittent atrial tachycardia.  This has settled down following switch from Norvasc to Cardizem CD which we will continue at 180 mg daily for now.  Continue observation.  2.  Coronary and aortic calcification by CT imaging.  Patient is asymptomatic at this time.  Continue aspirin, ACE inhibitor, and statin therapy.  3.  Abnormal LFTs as discussed above.  Patient has cut back alcohol intake.  Now following with Dr. Willey Blade who plans to repeat lab work in April.  He continues on Lipitor 40 mg daily, LFT abnormalities have been longer standing.  If this persists would suggest GI consultation.  Medication Adjustments/Labs and Tests Ordered: Current medicines are reviewed at length with the patient today.  Concerns regarding medicines are outlined above.   Tests Ordered: No orders of the defined types were placed in this  encounter.   Medication Changes: No orders of the defined types were placed in this encounter.   Disposition:  Follow up 6 months in the [place] office.  Signed, Satira Sark, MD, [facility] 02/11/2020 8:40 AM    [place] at [place]. [address] [place], [place] [address] Phone: [phone]; Fax: [phone]

## 2020-02-11 ENCOUNTER — Ambulatory Visit: Payer: Medicare Other | Admitting: Cardiology

## 2020-02-11 ENCOUNTER — Encounter: Payer: Self-pay | Admitting: Cardiology

## 2020-02-11 VITALS — BP 142/76 | HR 86 | Temp 99.1°F | Ht 71.0 in | Wt 242.0 lb

## 2020-02-11 DIAGNOSIS — I2510 Atherosclerotic heart disease of native coronary artery without angina pectoris: Secondary | ICD-10-CM

## 2020-02-11 DIAGNOSIS — R945 Abnormal results of liver function studies: Secondary | ICD-10-CM

## 2020-02-11 DIAGNOSIS — E782 Mixed hyperlipidemia: Secondary | ICD-10-CM

## 2020-02-11 DIAGNOSIS — I2584 Coronary atherosclerosis due to calcified coronary lesion: Secondary | ICD-10-CM | POA: Diagnosis not present

## 2020-02-11 DIAGNOSIS — R7989 Other specified abnormal findings of blood chemistry: Secondary | ICD-10-CM

## 2020-02-11 NOTE — Patient Instructions (Signed)
Medication Instructions:  Your physician recommends that you continue on your current medications as directed. Please refer to the Current Medication list given to you today.  *If you need a refill on your cardiac medications before your next appointment, please call your pharmacy*   Lab Work: none If you have labs (blood work) drawn today and your tests are completely normal, you will receive your results only by: Marland Kitchen MyChart Message (if you have MyChart) OR . A paper copy in the mail If you have any lab test that is abnormal or we need to change your treatment, we will call you to review the results.   Testing/Procedures: none   Follow-Up: At [facility], you and your health needs are our priority.  As part of our continuing mission to provide you with exceptional heart care, we have created designated Provider Care Teams.  These Care Teams include your primary Cardiologist (physician) and Advanced Practice Providers (APPs -  Physician Assistants and Nurse Practitioners) who all work together to provide you with the care you need, when you need it.  We recommend signing up for the patient portal called "MyChart".  Sign up information is provided on this After Visit Summary.  MyChart is used to connect with patients for Virtual Visits (Telemedicine).  Patients are able to view lab/test results, encounter notes, upcoming appointments, etc.  Non-urgent messages can be sent to your provider as well.   To learn more about what you can do with MyChart, go to [email or web address].    Your next appointment:   6 month(s)  The format for your next appointment:   In Person  Provider:   Rozann Lesches, MD   Other Instructions None    Thank you for choosing [facility] !

## 2020-04-14 ENCOUNTER — Ambulatory Visit: Payer: Medicare Other | Admitting: Family Medicine

## 2020-08-31 ENCOUNTER — Encounter: Payer: Self-pay | Admitting: Cardiology

## 2020-08-31 ENCOUNTER — Ambulatory Visit: Payer: Medicare Other | Admitting: Cardiology

## 2020-08-31 ENCOUNTER — Other Ambulatory Visit: Payer: Self-pay

## 2020-08-31 VITALS — BP 142/68 | HR 76 | Ht 71.0 in | Wt 245.0 lb

## 2020-08-31 DIAGNOSIS — I2510 Atherosclerotic heart disease of native coronary artery without angina pectoris: Secondary | ICD-10-CM

## 2020-08-31 DIAGNOSIS — I471 Supraventricular tachycardia: Secondary | ICD-10-CM

## 2020-08-31 DIAGNOSIS — I2584 Coronary atherosclerosis due to calcified coronary lesion: Secondary | ICD-10-CM | POA: Diagnosis not present

## 2020-08-31 NOTE — Progress Notes (Signed)
Cardiology Office Note  Date: 08/31/2020   ID: Rick [name], Rick [name] 08/29/1946, MRN [id number]  PCP:  Asencion Noble, MD  Cardiologist:  Rozann Lesches, MD Electrophysiologist:  None   Chief Complaint  Patient presents with  . Cardiac follow-up    History of Present Illness: Rick [name] is a 74 y.o. male last seen in March.  He presents for a routine visit.  Since last encounter he does not report any sense of palpitations, has had no exertional chest pain or worsening shortness of breath with typical ADLs.  He is limited by chronic back pain, not able to exercise with any regularity.  He continues to follow with Dr. Willey Blade, I note that his LFTs had improved by April.  We went over his current medications which are stable from a cardiovascular perspective and outlined below.  I personally reviewed his ECG today which shows normal sinus rhythm.  Past Medical History:  Diagnosis Date  . Arthritis   . Atherosclerosis of aorta ([place])   . Chronic obstructive pulmonary disease, unspecified ([place])   . CKD (chronic kidney disease) stage 3, GFR 30-59 ml/min   . Essential hypertension   . Hiatal hernia   . Hyperlipidemia   . Kidney tumor (benign)    Reportedly evaluated at [facility]  . Lateral ventral hernia   . Low back pain   . Male erectile disorder   . Type 2 diabetes mellitus ([place])     Past Surgical History:  Procedure Laterality Date  . BACK SURGERY    . INGUINAL HERNIA REPAIR  1974  . MUSCLE REPAIR     Right shoulder by Dr. Mayer Camel   . REPLACEMENT TOTAL KNEE Left   . TONSILLECTOMY      Current Outpatient Medications  Medication Sig Dispense Refill  . aspirin EC 81 MG tablet Take 81 mg by mouth daily.    Marland Kitchen atorvastatin (LIPITOR) 40 MG tablet Take 40 mg by mouth daily.    Marland Kitchen diltiazem (CARDIZEM CD) 180 MG 24 hr capsule Take 1 capsule (180 mg total) by mouth daily. 90 capsule 3  . glimepiride (AMARYL) 1 MG tablet Take 1 mg by mouth daily.  1  . HYDROcodone-acetaminophen  (NORCO) 10-325 MG tablet Take 1 tablet by mouth 2 (two) times daily as needed.  0  . JANUVIA 100 MG tablet Take 50 mg by mouth daily.   1  . lisinopril-hydrochlorothiazide (ZESTORETIC) 20-12.5 MG tablet Take 1 tablet by mouth daily.    . metFORMIN (GLUCOPHAGE) 500 MG tablet Take 1,000 mg by mouth daily.     . Multiple Vitamin (MULTIVITAMIN WITH MINERALS) TABS tablet Take 1 tablet by mouth daily.    . sildenafil (REVATIO) 20 MG tablet Take 20-100 mg by mouth as directed.    Viviana Simpler ELLIPTA 100-62.5-25 MCG/INH AEPB SMARTSIG:1 Inhalation Via Inhaler Daily     No current facility-administered medications for this visit.   Allergies:  Patient has no known allergies.   ROS:  Chronic back pain.  Physical Exam: VS:  BP (!) 142/68   Pulse 76   Ht 5\' 11"  (1.803 m)   Wt 245 lb (111.1 kg)   SpO2 97%   BMI 34.17 kg/m , BMI Body mass index is 34.17 kg/m.  Wt Readings from Last 3 Encounters:  08/31/20 245 lb (111.1 kg)  02/11/20 242 lb (109.8 kg)  11/12/19 247 lb (112 kg)    General: Patient appears comfortable at rest. HEENT: Conjunctiva and lids normal, wearing a mask.  Neck: Supple, no elevated JVP or carotid bruits, no thyromegaly. Lungs: No wheezing, nonlabored breathing at rest. Cardiac: Regular rate and rhythm, no S3 or significant systolic murmur. Extremities: No pitting edema, distal pulses 2+.  ECG:  An ECG dated 11/29/2019 was personally reviewed today and demonstrated:  Sinus rhythm with PACs and burst of atrial tachycardia.  Recent Labwork: 12/31/2019: ALT 135; AST 63     Component Value Date/Time   CHOL 169 11/19/2019 0854   TRIG 102 11/19/2019 0854   HDL 40 (L) 11/19/2019 0854   CHOLHDL 4.2 11/19/2019 0854   VLDL 20 11/19/2019 0854   LDLCALC 109 (H) 11/19/2019 0854  April 2021: BUN 28, creatinine 2.08, potassium 4.9, AST 35, ALT 67, hemoglobin A1c 7.3%  Other Studies Reviewed Today:  Echocardiogram 10/18/2016: Study Conclusions  - Left ventricle: The cavity size  was normal. Systolic function was normal. The estimated ejection fraction was in the range of 60% to 65%. Wall motion was normal; there were no regional wall motion abnormalities. Doppler parameters are consistent with both elevated ventricular end-diastolic filling pressure and elevated left atrial filling pressure. - Left atrium: The atrium was mildly dilated. - Atrial septum: No defect or patent foramen ovale was identified.  Assessment and Plan:  1.  History of PACs and intermittent atrial tachycardia, no active palpitations on Cardizem CD which he is tolerating well.  Continue observation.  2.  Coronary artery calcification and aortic calcification by CT imaging.  He is asymptomatic and continues on aspirin, ACE inhibitor, and statin therapy.  ECG reviewed and normal.  Medication Adjustments/Labs and Tests Ordered: Current medicines are reviewed at length with the patient today.  Concerns regarding medicines are outlined above.   Tests Ordered: Orders Placed This Encounter  Procedures  . EKG 12-Lead    Medication Changes: No orders of the defined types were placed in this encounter.   Disposition:  Follow up 1 year in the [place] office.  Signed, Satira Sark, MD, [facility] 08/31/2020 8:23 AM    [place] at [facility] [address], [place], [place] [address] Phone: [phone]; Fax: [phone]

## 2020-08-31 NOTE — Patient Instructions (Signed)
Medication Instructions:  Your physician recommends that you continue on your current medications as directed. Please refer to the Current Medication list given to you today.  *If you need a refill on your cardiac medications before your next appointment, please call your pharmacy*   Lab Work: None today If you have labs (blood work) drawn today and your tests are completely normal, you will receive your results only by: . MyChart Message (if you have MyChart) OR . A paper copy in the mail If you have any lab test that is abnormal or we need to change your treatment, we will call you to review the results.   Testing/Procedures: None today   Follow-Up: At [facility], you and your health needs are our priority.  As part of our continuing mission to provide you with exceptional heart care, we have created designated Provider Care Teams.  These Care Teams include your primary Cardiologist (physician) and Advanced Practice Providers (APPs -  Physician Assistants and Nurse Practitioners) who all work together to provide you with the care you need, when you need it.  We recommend signing up for the patient portal called "MyChart".  Sign up information is provided on this After Visit Summary.  MyChart is used to connect with patients for Virtual Visits (Telemedicine).  Patients are able to view lab/test results, encounter notes, upcoming appointments, etc.  Non-urgent messages can be sent to your provider as well.   To learn more about what you can do with MyChart, go to [email or web address].    Your next appointment:   12 month(s)  The format for your next appointment:   In Person  Provider:   Samuel McDowell, MD   Other Instructions None       Thank you for choosing [facility] !         

## 2020-10-10 ENCOUNTER — Other Ambulatory Visit: Payer: Self-pay | Admitting: Cardiology

## 2020-10-20 ENCOUNTER — Other Ambulatory Visit: Payer: Self-pay | Admitting: Internal Medicine

## 2020-10-20 DIAGNOSIS — R911 Solitary pulmonary nodule: Secondary | ICD-10-CM

## 2020-10-20 DIAGNOSIS — I700 Atherosclerosis of aorta: Secondary | ICD-10-CM

## 2020-11-09 ENCOUNTER — Ambulatory Visit
Admission: RE | Admit: 2020-11-09 | Discharge: 2020-11-09 | Disposition: A | Discharge status: Home / Self Care | Payer: Medicare Other | Source: Ambulatory Visit | Attending: Internal Medicine | Admitting: Internal Medicine

## 2020-11-09 DIAGNOSIS — R911 Solitary pulmonary nodule: Secondary | ICD-10-CM

## 2020-11-09 DIAGNOSIS — I700 Atherosclerosis of aorta: Secondary | ICD-10-CM

## 2021-04-02 ENCOUNTER — Emergency Department (HOSPITAL_COMMUNITY)
Admission: EM | Admit: 2021-04-02 | Discharge: 2021-04-02 | Disposition: A | Discharge status: Home / Self Care | Payer: Medicare Other | Attending: Emergency Medicine | Admitting: Emergency Medicine

## 2021-04-02 ENCOUNTER — Other Ambulatory Visit: Payer: Self-pay

## 2021-04-02 ENCOUNTER — Emergency Department (HOSPITAL_COMMUNITY): Payer: Medicare Other

## 2021-04-02 ENCOUNTER — Encounter (HOSPITAL_COMMUNITY): Payer: Self-pay | Admitting: Emergency Medicine

## 2021-04-02 DIAGNOSIS — J40 Bronchitis, not specified as acute or chronic: Secondary | ICD-10-CM

## 2021-04-02 DIAGNOSIS — Z7984 Long term (current) use of oral hypoglycemic drugs: Secondary | ICD-10-CM | POA: Insufficient documentation

## 2021-04-02 DIAGNOSIS — R059 Cough, unspecified: Secondary | ICD-10-CM | POA: Diagnosis present

## 2021-04-02 DIAGNOSIS — Z7982 Long term (current) use of aspirin: Secondary | ICD-10-CM | POA: Insufficient documentation

## 2021-04-02 DIAGNOSIS — J449 Chronic obstructive pulmonary disease, unspecified: Secondary | ICD-10-CM | POA: Insufficient documentation

## 2021-04-02 DIAGNOSIS — I129 Hypertensive chronic kidney disease with stage 1 through stage 4 chronic kidney disease, or unspecified chronic kidney disease: Secondary | ICD-10-CM | POA: Diagnosis not present

## 2021-04-02 DIAGNOSIS — Z87891 Personal history of nicotine dependence: Secondary | ICD-10-CM | POA: Insufficient documentation

## 2021-04-02 DIAGNOSIS — Z85528 Personal history of other malignant neoplasm of kidney: Secondary | ICD-10-CM | POA: Diagnosis not present

## 2021-04-02 DIAGNOSIS — Z20822 Contact with and (suspected) exposure to covid-19: Secondary | ICD-10-CM | POA: Diagnosis not present

## 2021-04-02 DIAGNOSIS — N184 Chronic kidney disease, stage 4 (severe): Secondary | ICD-10-CM | POA: Insufficient documentation

## 2021-04-02 DIAGNOSIS — E1140 Type 2 diabetes mellitus with diabetic neuropathy, unspecified: Secondary | ICD-10-CM | POA: Diagnosis not present

## 2021-04-02 DIAGNOSIS — Z96652 Presence of left artificial knee joint: Secondary | ICD-10-CM | POA: Insufficient documentation

## 2021-04-02 DIAGNOSIS — Z79899 Other long term (current) drug therapy: Secondary | ICD-10-CM | POA: Insufficient documentation

## 2021-04-02 DIAGNOSIS — E1122 Type 2 diabetes mellitus with diabetic chronic kidney disease: Secondary | ICD-10-CM | POA: Insufficient documentation

## 2021-04-02 LAB — BASIC METABOLIC PANEL
Anion gap: 10 (ref 5–15)
BUN: 39 mg/dL — ABNORMAL HIGH (ref 8–23)
CO2: 24 mmol/L (ref 22–32)
Calcium: 9.3 mg/dL (ref 8.9–10.3)
Chloride: 100 mmol/L (ref 98–111)
Creatinine, Ser: 2.01 mg/dL — ABNORMAL HIGH (ref 0.61–1.24)
GFR, Estimated: 34 mL/min — ABNORMAL LOW (ref >60)
Glucose, Bld: 185 mg/dL — ABNORMAL HIGH (ref 70–99)
Potassium: 4 mmol/L (ref 3.5–5.1)
Sodium: 134 mmol/L — ABNORMAL LOW (ref 135–145)

## 2021-04-02 LAB — CBC WITH DIFFERENTIAL/PLATELET
Abs Immature Granulocytes: 0.06 10*3/uL (ref 0.00–0.07)
Basophils Absolute: 0.1 10*3/uL (ref 0.0–0.1)
Basophils Relative: 1 %
Eosinophils Absolute: 0.2 10*3/uL (ref 0.0–0.5)
Eosinophils Relative: 3 %
HCT: 36.4 % — ABNORMAL LOW (ref 39.0–52.0)
Hemoglobin: 11.7 g/dL — ABNORMAL LOW (ref 13.0–17.0)
Immature Granulocytes: 1 %
Lymphocytes Relative: 24 %
Lymphs Abs: 2.1 10*3/uL (ref 0.7–4.0)
MCH: 28.4 pg (ref 26.0–34.0)
MCHC: 32.1 g/dL (ref 30.0–36.0)
MCV: 88.3 fL (ref 80.0–100.0)
Monocytes Absolute: 0.6 10*3/uL (ref 0.1–1.0)
Monocytes Relative: 7 %
Neutro Abs: 5.6 10*3/uL (ref 1.7–7.7)
Neutrophils Relative %: 64 %
Platelets: 285 10*3/uL (ref 150–400)
RBC: 4.12 MIL/uL — ABNORMAL LOW (ref 4.22–5.81)
RDW: 14.4 % (ref 11.5–15.5)
WBC: 8.7 10*3/uL (ref 4.0–10.5)
nRBC: 0 % (ref 0.0–0.2)

## 2021-04-02 LAB — RESP PANEL BY RT-PCR (FLU A&B, COVID) ARPGX2
Influenza A by PCR: NEGATIVE
Influenza B by PCR: NEGATIVE
SARS Coronavirus 2 by RT PCR: NEGATIVE

## 2021-04-02 LAB — TROPONIN I (HIGH SENSITIVITY): Troponin I (High Sensitivity): 7 ng/L (ref <18)

## 2021-04-02 MED ORDER — DOXYCYCLINE HYCLATE 100 MG PO TABS
100.0000 mg | ORAL_TABLET | Freq: Once | ORAL | Status: AC
  Administered 2021-04-02: 100 mg via ORAL
  Filled 2021-04-02: qty 1

## 2021-04-02 MED ORDER — ALBUTEROL SULFATE HFA 108 (90 BASE) MCG/ACT IN AERS
2.0000 | INHALATION_SPRAY | Freq: Once | RESPIRATORY_TRACT | Status: AC
  Administered 2021-04-02: 2 via RESPIRATORY_TRACT
  Filled 2021-04-02: qty 6.7

## 2021-04-02 MED ORDER — BENZONATATE 100 MG PO CAPS: 100.0000 mg | ORAL_CAPSULE | Freq: Three times a day (TID) | ORAL | 0 refills | Status: DC | PRN

## 2021-04-02 MED ORDER — BENZONATATE 100 MG PO CAPS
100.0000 mg | ORAL_CAPSULE | Freq: Once | ORAL | Status: AC
  Administered 2021-04-02: 100 mg via ORAL
  Filled 2021-04-02: qty 1

## 2021-04-02 MED ORDER — DOXYCYCLINE HYCLATE 100 MG PO CAPS: 100.0000 mg | ORAL_CAPSULE | Freq: Two times a day (BID) | ORAL | 0 refills | Status: DC

## 2021-04-02 NOTE — ED Notes (Signed)
Pt. Ambulated well with no assistance and no increase to his work of breathing. Pts. SPO2 maintained 94% or greater while ambulating. Pt. Started coughing upon returning to his stretcher with no sputum production. Pt. Notified staff he had some orthopnea. MD notified.

## 2021-04-02 NOTE — ED Triage Notes (Addendum)
Pt states he has had a bad cough (non-productive) since yesterday morning. States he thinks he has "pneumonia".

## 2021-04-02 NOTE — ED Provider Notes (Signed)
[facility] EMERGENCY DEPARTMENT Provider Note   CSN: [id number] Arrival date & time: 04/02/21  0126     History Chief Complaint  Patient presents with  . Cough    Rick [name] is a 75 y.o. male.  Patient with history of COPD on Trelegy but no other inhalers, CKD, hypertension, hyperlipidemia, diabetes presenting with cough that onset earlier this morning and got worse tonight.  Reports dry cough and not able to cough up anything.  Has not taken anything for it at home.  Had runny nose and sore throat several days ago but this improved.  No fever.  Does have some chest pain with coughing but is not pleuritic or exertional.  States he is concerned that he has pneumonia as he has had a several times before this feels similar.  Cough got worse tonight when he tried to lie down to sleep.  States he can normally sleep lying flat without a problem.  No leg pain or leg swelling.  No history of CHF or CAD.  Denies any sick contacts or recent travel.  He is COVID vaccinated.  No abdominal pain, vomiting or diarrhea.  The history is provided by the patient.  Cough Associated symptoms: rhinorrhea   Associated symptoms: no chest pain, no fever, no headaches, no myalgias and no rash        Past Medical History:  Diagnosis Date  . Arthritis   . Atherosclerosis of aorta ([place])   . Chronic obstructive pulmonary disease, unspecified ([place])   . CKD (chronic kidney disease) stage 3, GFR 30-59 ml/min (HCC)   . Essential hypertension   . Hiatal hernia   . Hyperlipidemia   . Kidney tumor (benign)    Reportedly evaluated at [facility]  . Lateral ventral hernia   . Low back pain   . Male erectile disorder   . Type 2 diabetes mellitus [facility])     Patient Active Problem List   Diagnosis Date Noted  . Cough   . Essential (primary) hypertension   . Pure hypercholesterolemia, unspecified   . Type 2 diabetes mellitus without complications ([place])   . Atherosclerosis of aorta ([place])   . Chronic kidney  disease, stage 4 (severe) ([place])   . Chronic obstructive pulmonary disease, unspecified ([place])   . Hypertensive chronic kidney disease with stage 1 through stage 4 chronic kidney disease, or unspecified chronic kidney disease   . Male erectile disorder   . Obesity, unspecified   . Shortness of breath   . Type 2 diabetes mellitus with diabetic nephropathy ([place])   . Type 2 diabetes mellitus with other specified complication ([place])   . 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    Past Surgical History:  Procedure Laterality Date  . BACK SURGERY    . INGUINAL HERNIA REPAIR  1974  . MUSCLE REPAIR     Right shoulder by Dr. Mayer Camel   . REPLACEMENT TOTAL KNEE Left   . TONSILLECTOMY         Family History  Problem Relation Age of Onset  . Stroke Mother   . Heart attack Father   . Heart attack Maternal Grandmother   . Heart failure Maternal Grandfather   . Heart attack Paternal Grandmother   . Heart attack Paternal Grandfather     Social History   Tobacco Use  . Smoking status: Former Smoker    Packs/day: 1.50    Years: 30.00    Pack years: 45.00    Types: Cigarettes    Quit  date: 06/09/1999    Years since quitting: 21.8  . Smokeless tobacco: Never Used  Vaping Use  . Vaping Use: Never used  Substance Use Topics  . Alcohol use: Yes    Alcohol/week: 24.0 standard drinks    Types: 24 Cans of beer per week    Comment: Occasional  . Drug use: No    Home Medications Prior to Admission medications   Medication Sig Start Date End Date Taking? Authorizing Provider  aspirin EC 81 MG tablet Take 81 mg by mouth daily.    [provider]  atorvastatin (LIPITOR) 40 MG tablet Take 40 mg by mouth daily.    [provider]  diltiazem (CARDIZEM CD) 180 MG 24 hr capsule TAKE 1 CAPSULE BY MOUTH EVERY DAY 10/10/20   Satira Sark, MD  glimepiride (AMARYL) 1 MG tablet Take 1 mg by mouth daily. 11/10/16   [provider]  HYDROcodone-acetaminophen (NORCO)  10-325 MG tablet Take 1 tablet by mouth 2 (two) times daily as needed. 08/21/16   [provider]  JANUVIA 100 MG tablet Take 50 mg by mouth daily.  08/16/16   [provider]  lisinopril-hydrochlorothiazide (ZESTORETIC) 20-12.5 MG tablet Take 1 tablet by mouth daily. 10/24/19   [provider]  metFORMIN (GLUCOPHAGE) 500 MG tablet Take 1,000 mg by mouth daily.     [provider]  Multiple Vitamin (MULTIVITAMIN WITH MINERALS) TABS tablet Take 1 tablet by mouth daily.    [provider]  sildenafil (REVATIO) 20 MG tablet Take 20-100 mg by mouth as directed.    [provider]  Donnal Debar 100-62.5-25 MCG/INH AEPB SMARTSIG:1 Inhalation Via Inhaler Daily 10/24/19   [provider]    Allergies    Patient has no known allergies.  Review of Systems   Review of Systems  Constitutional: Negative for activity change, appetite change and fever.  HENT: Positive for congestion and rhinorrhea.   Respiratory: Positive for cough and chest tightness.   Cardiovascular: Negative for chest pain.  Gastrointestinal: Negative for abdominal pain, nausea and vomiting.  Genitourinary: Negative for dysuria and hematuria.  Musculoskeletal: Negative for arthralgias and myalgias.  Skin: Negative for rash.  Neurological: Negative for dizziness, weakness and headaches.   all other systems are negative except as noted in the HPI and PMH.   Physical Exam Updated Vital Signs BP (!) 164/76 (BP Location: Left Arm)   Pulse 80   Temp 98.6 F (37 C) (Oral)   Resp 18   Ht 5\' 11"  (1.803 m)   Wt 111.1 kg   SpO2 96%   BMI 34.17 kg/m   Physical Exam Vitals and nursing note reviewed.  Constitutional:      General: He is not in acute distress.    Appearance: He is well-developed.  HENT:     Head: Normocephalic and atraumatic.     Mouth/Throat:     Pharynx: No oropharyngeal exudate.  Eyes:     Conjunctiva/sclera: Conjunctivae normal.     Pupils:  Pupils are equal, round, and reactive to light.  Neck:     Comments: No meningismus. Cardiovascular:     Rate and Rhythm: Normal rate and regular rhythm.     Heart sounds: Normal heart sounds. No murmur heard.   Pulmonary:     Effort: Pulmonary effort is normal. No respiratory distress.     Breath sounds: Normal breath sounds. No wheezing.  Chest:     Chest wall: No tenderness.  Abdominal:  Palpations: Abdomen is soft.     Tenderness: There is no abdominal tenderness. There is no guarding or rebound.  Musculoskeletal:        General: No tenderness. Normal range of motion.     Cervical back: Normal range of motion and neck supple.  Skin:    General: Skin is warm.  Neurological:     Mental Status: He is alert and oriented to person, place, and time.     Cranial Nerves: No cranial nerve deficit.     Motor: No abnormal muscle tone.     Coordination: Coordination normal.     Comments: No ataxia on finger to nose bilaterally. No pronator drift. 5/5 strength throughout. CN 2-12 intact.Equal grip strength. Sensation intact.   Psychiatric:        Behavior: Behavior normal.     ED Results / Procedures / Treatments   Labs (all labs ordered are listed, but only abnormal results are displayed) Labs Reviewed  CBC WITH DIFFERENTIAL/PLATELET - Abnormal; Notable for the following components:      Result Value   RBC 4.12 (*)    Hemoglobin 11.7 (*)    HCT 36.4 (*)    All other components within normal limits  BASIC METABOLIC PANEL - Abnormal; Notable for the following components:   Sodium 134 (*)    Glucose, Bld 185 (*)    BUN 39 (*)    Creatinine, Ser 2.01 (*)    GFR, Estimated 34 (*)    All other components within normal limits  RESP PANEL BY RT-PCR (FLU A&B, COVID) ARPGX2  TROPONIN I (HIGH SENSITIVITY)    EKG EKG Interpretation  Date/Time:  Sunday April 02 2021 02:07:57 EDT Ventricular Rate:  80 PR Interval:  177 QRS Duration: 84 QT Interval:  360 QTC  Calculation: 416 R Axis:   59 Text Interpretation: Sinus rhythm Atrial premature complex RSR' in V1 or V2, probably normal variant No significant change was found Confirmed by Ezequiel Essex [phone]) on 04/02/2021 2:36:31 AM   Radiology DG Chest Portable 1 View  Result Date: 04/02/2021 CLINICAL DATA:  Cough since yesterday.  Former smoker. EXAM: PORTABLE CHEST 1 VIEW COMPARISON:  06/08/2014 FINDINGS: Heart size and pulmonary vascularity are normal. No airspace disease or consolidation in the lungs. No pleural effusions. No pneumothorax. Mediastinal contours appear intact. IMPRESSION: No active disease. Electronically Signed   By: Lucienne Capers M.D.   On: 04/02/2021 02:17    Procedures Procedures   Medications Ordered in ED Medications  albuterol (VENTOLIN HFA) 108 (90 Base) MCG/ACT inhaler 2 puff (has no administration in time range)    ED Course  I have reviewed the triage vital signs and the nursing notes.  Pertinent labs & imaging results that were available during my care of the patient were reviewed by me and considered in my medical decision making (see chart for details).    MDM Rules/Calculators/A&P                         History of COPD with cough x2 days.  Nonproductive.  No hypoxia or increased work of breathing.  Clear lungs.  Chest x-ray is negative.  EKG is nonischemic.  Labs showed creatinine of 2.0.  No recent values.  Creatinine was 1.7 in August 2020 Troponin negative.   Patient able to ambulate without desaturation.  Denies shortness of breath.  Low suspicion for ACS, CHF exacerbation.  Does have COPD but is not wheezing.  Will treat  for suspected bronchitis.  Patient very worried about pneumonia and requesting antibiotics despite no pneumonia seen on x-ray. Discussed that bronchitis is usually viral in nature but given his concerns will initiate antibiotic course.  We will give bronchodilators and Tessalon Perles. Informed of his worsening creatinine  and need for follow-up with his PCP. Return precautions discussed.  Rick [name] was evaluated in Emergency Department on 04/02/2021 for the symptoms described in the history of present illness. He was evaluated in the context of the global COVID-19 pandemic, which necessitated consideration that the patient might be at risk for infection with the SARS-CoV-2 virus that causes COVID-19. Institutional protocols and algorithms that pertain to the evaluation of patients at risk for COVID-19 are in a state of rapid change based on information released by regulatory bodies including the CDC and federal and state organizations. These policies and algorithms were followed during the patient's care in the ED.  Final Clinical Impression(s) / ED Diagnoses Final diagnoses:  Cough  Bronchitis    Rx / DC Orders ED Discharge Orders    None       Bathsheba Durrett, Annie Main, MD 04/02/21 [phone]

## 2021-04-02 NOTE — Discharge Instructions (Addendum)
Take the antibiotics as prescribed and follow-up with your doctor.  Your kidney function today is slightly worse from 2020.  Follow-up with your doctor regarding this. Return to the ED with chest pain, shortness of breath, any other concerns.

## 2021-07-12 NOTE — Progress Notes (Signed)
Cardiology Office Note  Date: 07/13/2021   ID: CODEE TUTSON, DOB 03/29/1946, MRN [id number]  PCP:  Asencion Noble, MD  Cardiologist:  Rozann Lesches, MD Electrophysiologist:  None   Chief Complaint  Patient presents with   Cardiac follow-up    History of Present Illness: Rick [name] is a 75 y.o. male last seen in September 2021.  He is here for a follow-up visit.  Reports no sense of palpitations or chest pain with typical activities.  He continues to follow regularly with Dr. Willey Blade and has a visit with lab work pending.  I reviewed his medications which are noted below.  He has done well on Cardizem CD for suppression of frequent PACs and intermittent atrial tachycardia.  Past Medical History:  Diagnosis Date   Arthritis    Atherosclerosis of aorta (HCC)    Chronic obstructive pulmonary disease, unspecified (HCC)    CKD (chronic kidney disease) stage 3, GFR 30-59 ml/min (HCC)    Essential hypertension    Hiatal hernia    Hyperlipidemia    Kidney tumor (benign)    Reportedly evaluated at [facility]   Lateral ventral hernia    Low back pain    Male erectile disorder    Type 2 diabetes mellitus ([place])     Past Surgical History:  Procedure Laterality Date   BACK SURGERY     INGUINAL HERNIA REPAIR  1974   MUSCLE REPAIR     Right shoulder by Dr. Mayer Camel    REPLACEMENT TOTAL KNEE Left    TONSILLECTOMY      Current Outpatient Medications  Medication Sig Dispense Refill   atorvastatin (LIPITOR) 40 MG tablet Take 40 mg by mouth daily.     diltiazem (CARDIZEM CD) 180 MG 24 hr capsule TAKE 1 CAPSULE BY MOUTH EVERY DAY 90 capsule 3   doxycycline (VIBRAMYCIN) 100 MG capsule Take 1 capsule (100 mg total) by mouth 2 (two) times daily. 20 capsule 0   glimepiride (AMARYL) 2 MG tablet Take 2 mg by mouth every morning.     HYDROcodone-acetaminophen (NORCO) 10-325 MG tablet Take 1 tablet by mouth 2 (two) times daily as needed.  0   JANUVIA 100 MG tablet Take 50 mg by mouth daily.   1    lisinopril-hydrochlorothiazide (ZESTORETIC) 20-12.5 MG tablet Take 2 tablets by mouth daily.     metFORMIN (GLUCOPHAGE) 500 MG tablet Take 1,000 mg by mouth daily.      Multiple Vitamin (MULTIVITAMIN WITH MINERALS) TABS tablet Take 1 tablet by mouth daily.     TRELEGY ELLIPTA 100-62.5-25 MCG/INH AEPB SMARTSIG:1 Inhalation Via Inhaler Daily     aspirin EC 81 MG tablet Take 81 mg by mouth daily. (Patient not taking: Reported on 07/13/2021)     benzonatate (TESSALON) 100 MG capsule Take 1 capsule (100 mg total) by mouth 3 (three) times daily as needed for cough. (Patient not taking: Reported on 07/13/2021) 21 capsule 0   glimepiride (AMARYL) 1 MG tablet Take 1 mg by mouth daily. (Patient not taking: Reported on 07/13/2021)  1   sildenafil (REVATIO) 20 MG tablet Take 20-100 mg by mouth as directed. (Patient not taking: Reported on 07/13/2021)     No current facility-administered medications for this visit.   Allergies:  Patient has no known allergies.   ROS: No orthopnea or PND.  No syncope.  Physical Exam: VS:  BP 130/60   Pulse 91   Ht 5\' 11"  (1.803 m)   Wt 255 lb (115.7 kg)  SpO2 97%   BMI 35.57 kg/m , BMI Body mass index is 35.57 kg/m.  Wt Readings from Last 3 Encounters:  07/13/21 255 lb (115.7 kg)  04/02/21 245 lb (111.1 kg)  08/31/20 245 lb (111.1 kg)    General: Patient appears comfortable at rest. HEENT: Conjunctiva and lids normal, wearing a mask. Neck: Supple, no elevated JVP or carotid bruits, no thyromegaly. Lungs: Clear to auscultation, nonlabored breathing at rest. Cardiac: Regular rate and rhythm, no S3 or significant systolic murmur. Extremities: No pitting edema.  ECG:  An ECG dated 04/02/2021 was personally reviewed today and demonstrated:  Sinus rhythm with R' in lead V1 and V2.  Recent Labwork: 04/02/2021: BUN 39; Creatinine, Ser 2.01; Hemoglobin 11.7; Platelets 285; Potassium 4.0; Sodium 134     Component Value Date/Time   CHOL 169 11/19/2019 0854   TRIG 102  11/19/2019 0854   HDL 40 (L) 11/19/2019 0854   CHOLHDL 4.2 11/19/2019 0854   VLDL 20 11/19/2019 0854   LDLCALC 109 (H) 11/19/2019 0854   Other Studies Reviewed Today:  Chest x-ray 04/02/2021: FINDINGS: Heart size and pulmonary vascularity are normal. No airspace disease or consolidation in the lungs. No pleural effusions. No pneumothorax. Mediastinal contours appear intact.   IMPRESSION: No active disease.  Assessment and Plan:  1.  History of frequent PACs and intermittent atrial tachycardia, doing well on Cardizem CD with no active palpitations.  Continue observation.  2.  Asymptomatic coronary artery calcification and aortic calcification based on CT imaging.  He remains on aspirin and statin therapy.  Would aim for LDL 70 or less if possible.  Keep follow-up with Dr. Willey Blade.  Medication Adjustments/Labs and Tests Ordered: Current medicines are reviewed at length with the patient today.  Concerns regarding medicines are outlined above.   Tests Ordered: No orders of the defined types were placed in this encounter.   Medication Changes: No orders of the defined types were placed in this encounter.   Disposition:  Follow up  1 year.  Signed, Satira Sark, MD, [facility] 07/13/2021 2:17 PM    [facility] at [facility] [address], [place],  [address] Phone: [phone]; Fax: [phone]

## 2021-07-13 ENCOUNTER — Other Ambulatory Visit: Payer: Self-pay

## 2021-07-13 ENCOUNTER — Encounter: Payer: Self-pay | Admitting: Cardiology

## 2021-07-13 ENCOUNTER — Ambulatory Visit: Payer: Medicare Other | Admitting: Cardiology

## 2021-07-13 VITALS — BP 130/60 | HR 91 | Ht 71.0 in | Wt 255.0 lb

## 2021-07-13 DIAGNOSIS — I471 Supraventricular tachycardia: Secondary | ICD-10-CM | POA: Diagnosis not present

## 2021-07-13 DIAGNOSIS — I2510 Atherosclerotic heart disease of native coronary artery without angina pectoris: Secondary | ICD-10-CM | POA: Diagnosis not present

## 2021-07-13 NOTE — Patient Instructions (Signed)
Medication Instructions:  Your physician recommends that you continue on your current medications as directed. Please refer to the Current Medication list given to you today.  *If you need a refill on your cardiac medications before your next appointment, please call your pharmacy*   Lab Work: None If you have labs (blood work) drawn today and your tests are completely normal, you will receive your results only by: [facility] (if you have MyChart) OR A paper copy in the mail If you have any lab test that is abnormal or we need to change your treatment, we will call you to review the results.   Testing/Procedures: None   Follow-Up: At [facility], you and your health needs are our priority.  As part of our continuing mission to provide you with exceptional heart care, we have created designated Provider Care Teams.  These Care Teams include your primary Cardiologist (physician) and Advanced Practice Providers (APPs -  Physician Assistants and Nurse Practitioners) who all work together to provide you with the care you need, when you need it.  We recommend signing up for the patient portal called "MyChart".  Sign up information is provided on this After Visit Summary.  MyChart is used to connect with patients for Virtual Visits (Telemedicine).  Patients are able to view lab/test results, encounter notes, upcoming appointments, etc.  Non-urgent messages can be sent to your provider as well.   To learn more about what you can do with MyChart, go to [email or web address].    Your next appointment:   12 month(s)  The format for your next appointment:   In Person  Provider:   Rozann Lesches, MD   Other Instructions

## 2021-10-11 ENCOUNTER — Other Ambulatory Visit: Payer: Self-pay | Admitting: Cardiology

## 2022-04-11 IMAGING — CT CT CHEST-ABD-PELV W/O CM
1 of 2 series · 12 of 32 positions shown, 18 images · non-contrast
Comparison: Chest CT 09/16/2019 and CT abdomen/pelvis 12/26/2017

CLINICAL DATA: History of left nephrectomy for renal cell
carcinoma. Followup pulmonary nodules.

EXAM:
CT CHEST, ABDOMEN AND PELVIS WITHOUT CONTRAST
TECHNIQUE: Multidetector CT imaging of the chest, abdomen and pelvis was
performed following the standard protocol without IV contrast.

[Series 2: chest/abd/pelvis w/(date) · axial · 0.98mm/px · z∈[-694,-94]mm · 12 of 142 slices shown, 18 images]
[im 11/142  soft-tissue]
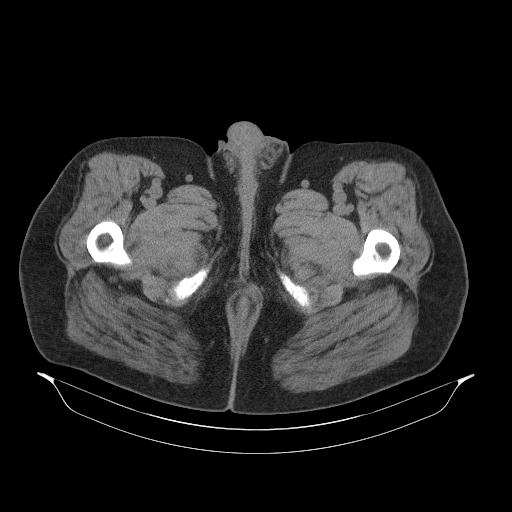
[im 11/142  bone]
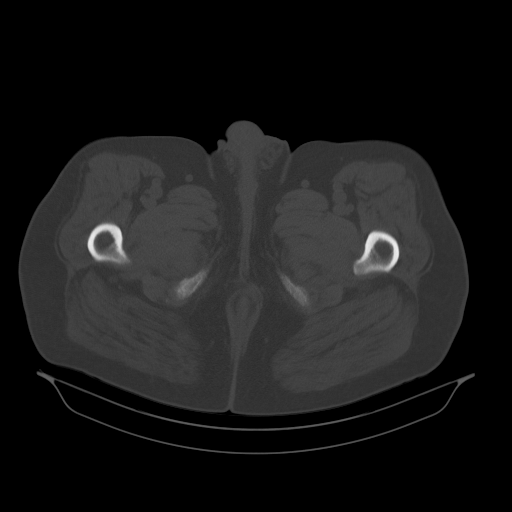
[im 21/142  soft-tissue]
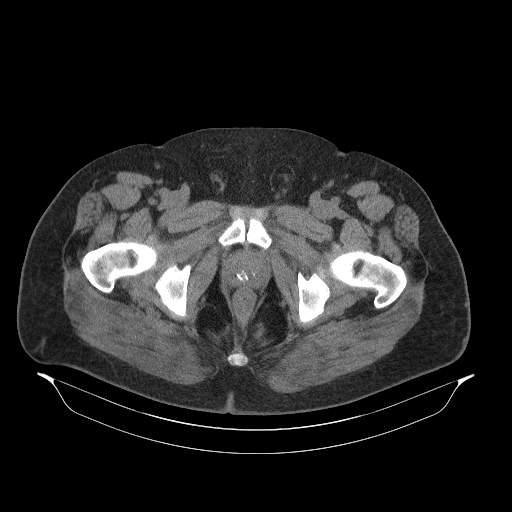
[im 31/142  soft-tissue]
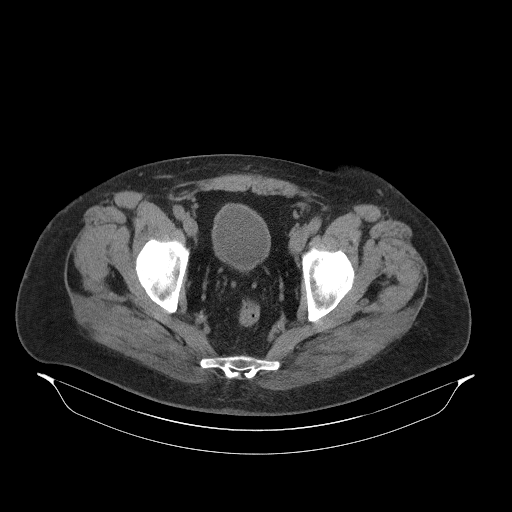
[im 41/142  soft-tissue]
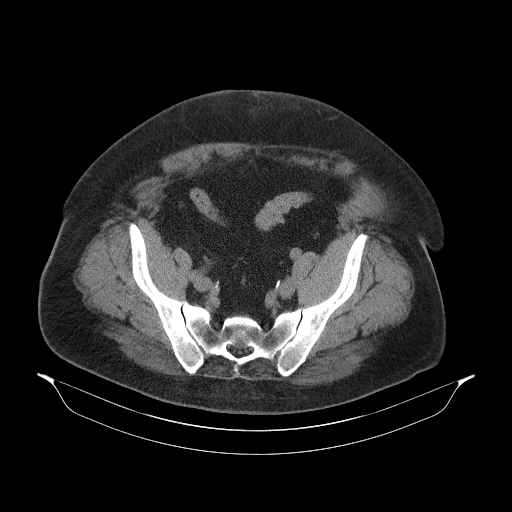
[im 51/142  soft-tissue]
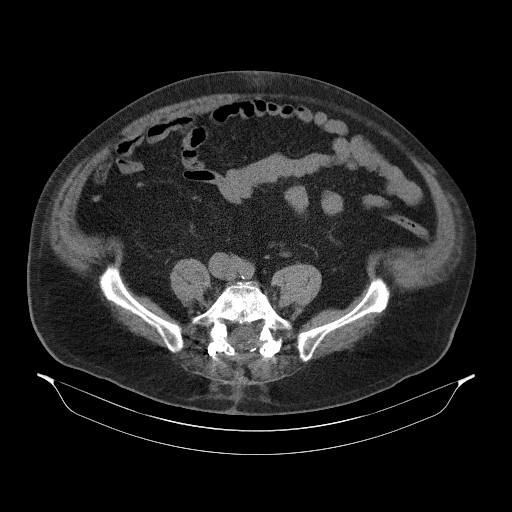
[im 61/142  soft-tissue]
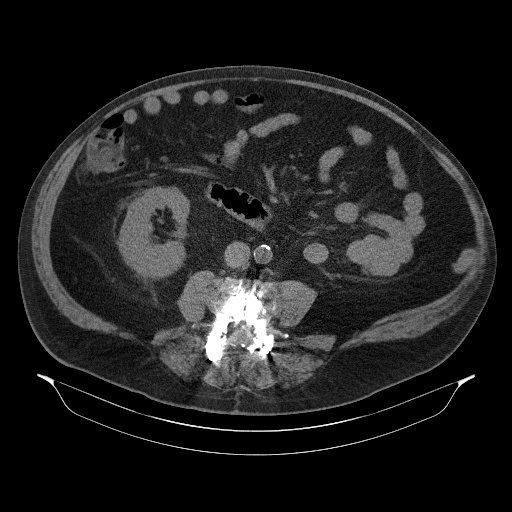
[im 81/142  soft-tissue]
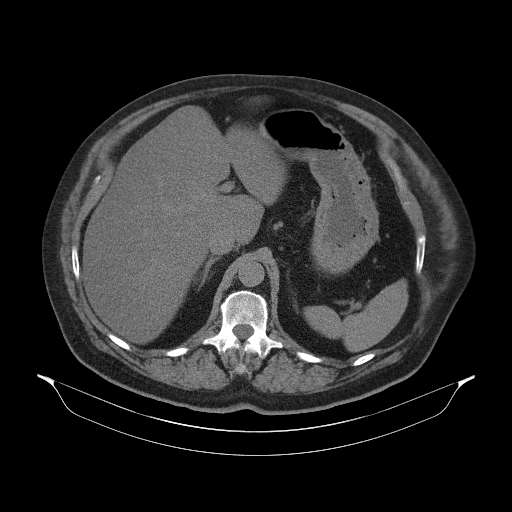
[im 91/142  soft-tissue]
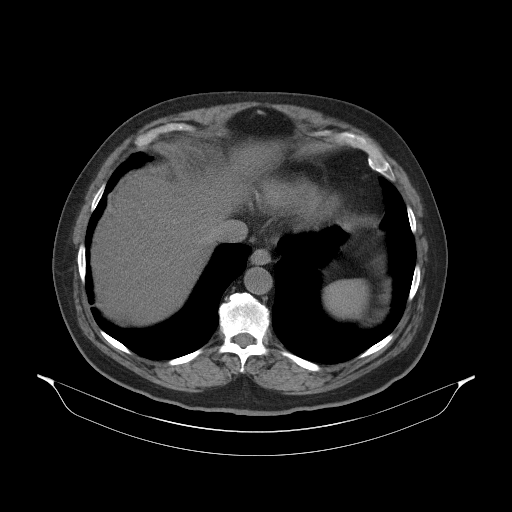
[im 101/142  soft-tissue]
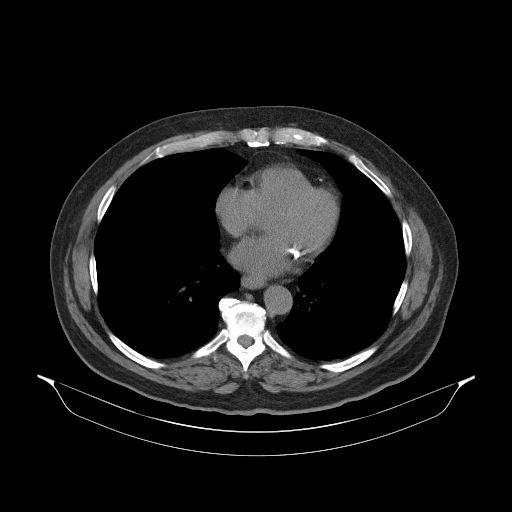
[im 101/142  lung]
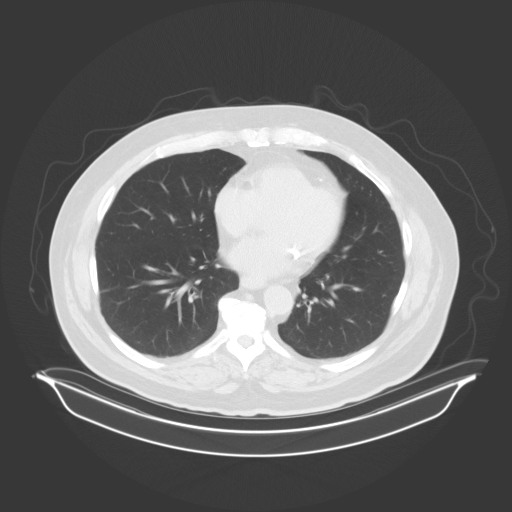
[im 101/142  bone]
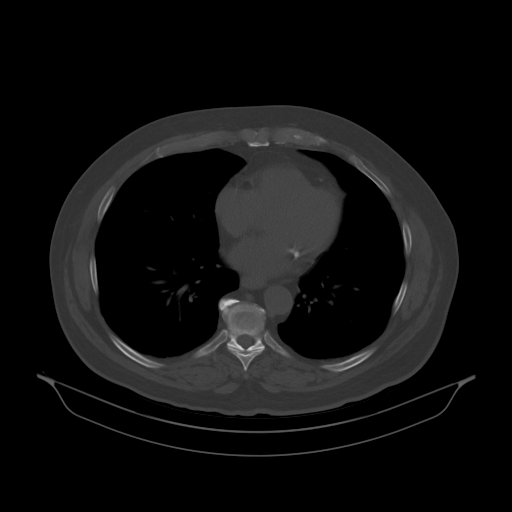
[im 111/142  soft-tissue]
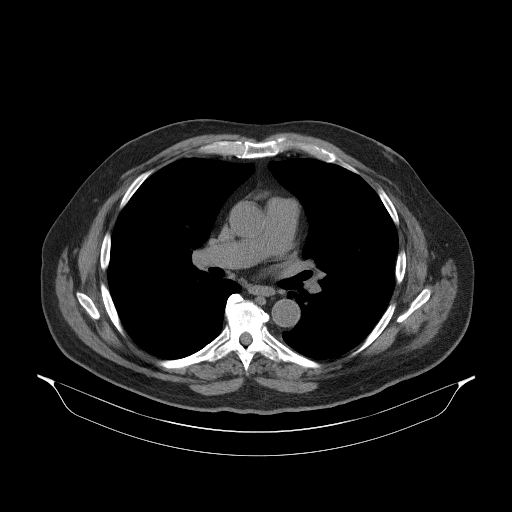
[im 111/142  lung]
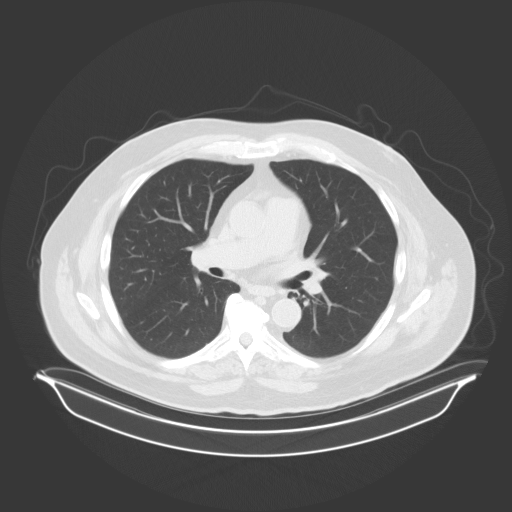
[im 121/142  soft-tissue]
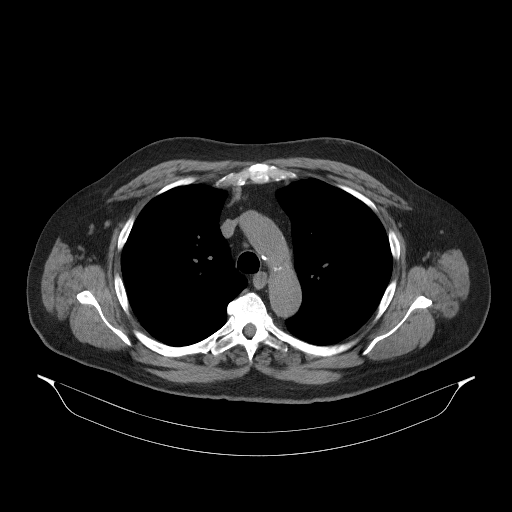
[im 121/142  lung]
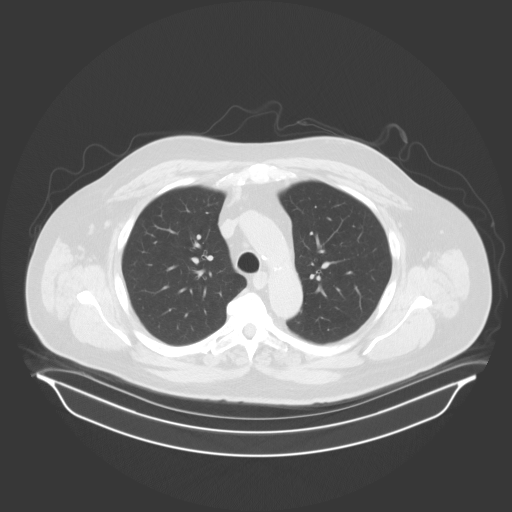
[im 131/142  soft-tissue]
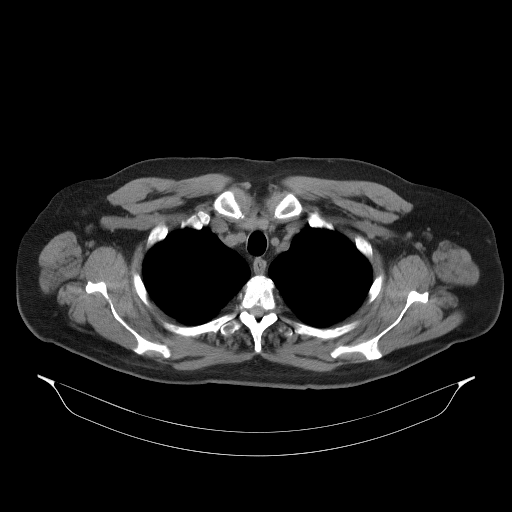
[im 131/142  lung]
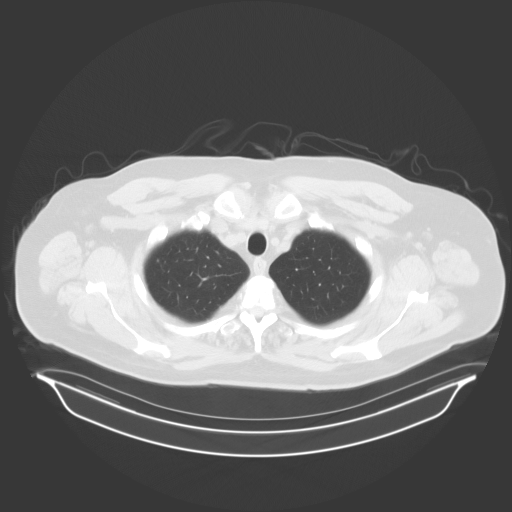

[12 of 32 positions shown; findings below may reference images not displayed]

FINDINGS: CT CHEST FINDINGS

Cardiovascular: The heart is normal in size. No pericardial
effusion. Mild tortuosity and calcification of the thoracic aorta is
stable. Stable three-vessel coronary artery calcifications.

Mediastinum/Nodes: No mediastinal or hilar mass or adenopathy. Small
scattered sub 8 mm lymph nodes are stable. The esophagus is grossly
normal.

Lungs/Pleura: No acute pulmonary findings. No worrisome pulmonary
lesions. Small scattered sub 4 mm pulmonary nodules are unchanged.
No new pulmonary nodules. No pleural effusions or pleural nodules.

Musculoskeletal: No chest wall mass, supraclavicular or axillary
adenopathy. The thyroid gland appears normal. No significant bony
findings.

CT ABDOMEN PELVIS FINDINGS

Hepatobiliary: No hepatic lesions are identified without contrast.
The gallbladder appears normal. No common bile duct dilatation.

Pancreas: No mass, inflammation or ductal dilatation.

Spleen: Normal size.  No focal lesions.

Adrenals/Urinary Tract: The adrenal glands are normal.

Status post left nephrectomy. The right kidney demonstrates mild
compensatory hypertrophy. No right renal lesions are identified
without contrast. No renal or obstructing ureteral calculi. No
bladder mass or bladder calculi.

Stomach/Bowel: The stomach, duodenum, small bowel and colon are
grossly normal without oral contrast. No inflammatory changes, mass
lesions or obstructive findings. The appendix is normal. Scattered
colonic diverticuli but no findings for acute diverticulitis.

Vascular/Lymphatic: Scattered vascular calcifications. No aneurysm.
No mesenteric or retroperitoneal mass or adenopathy. Stable adjacent
borderline enlarged paraduodenal lymph nodes on image number 69/2.
These measure 11 and 13 mm but are unchanged.

Reproductive: The prostate gland and seminal vesicles are
unremarkable.

Other: No pelvic mass or adenopathy. No free pelvic fluid
collections. No inguinal mass or adenopathy. No abdominal wall
hernia or subcutaneous lesions.

Musculoskeletal: No significant bony findings. Stable lumbar fusion
hardware. No complicating features.
IMPRESSION: 1. Status post left nephrectomy. No findings for recurrent tumor or
metastatic disease.
2. Stable borderline enlarged paraduodenal lymph nodes.
3. Stable small scattered sub 4 mm pulmonary nodules. No new or
progressive findings.
4. Stable three-vessel coronary artery calcifications.
5. Aortic atherosclerosis.

Aortic Atherosclerosis (ZCTLO-NDI.I).

## 2022-09-02 IMAGING — DX DG CHEST 1V PORT
1 series · 1 of 1 positions shown · non-contrast
Comparison: 06/08/2014

CLINICAL DATA: Cough since yesterday.  Former smoker.

EXAM:
PORTABLE CHEST 1 VIEW

[chest ap grid]
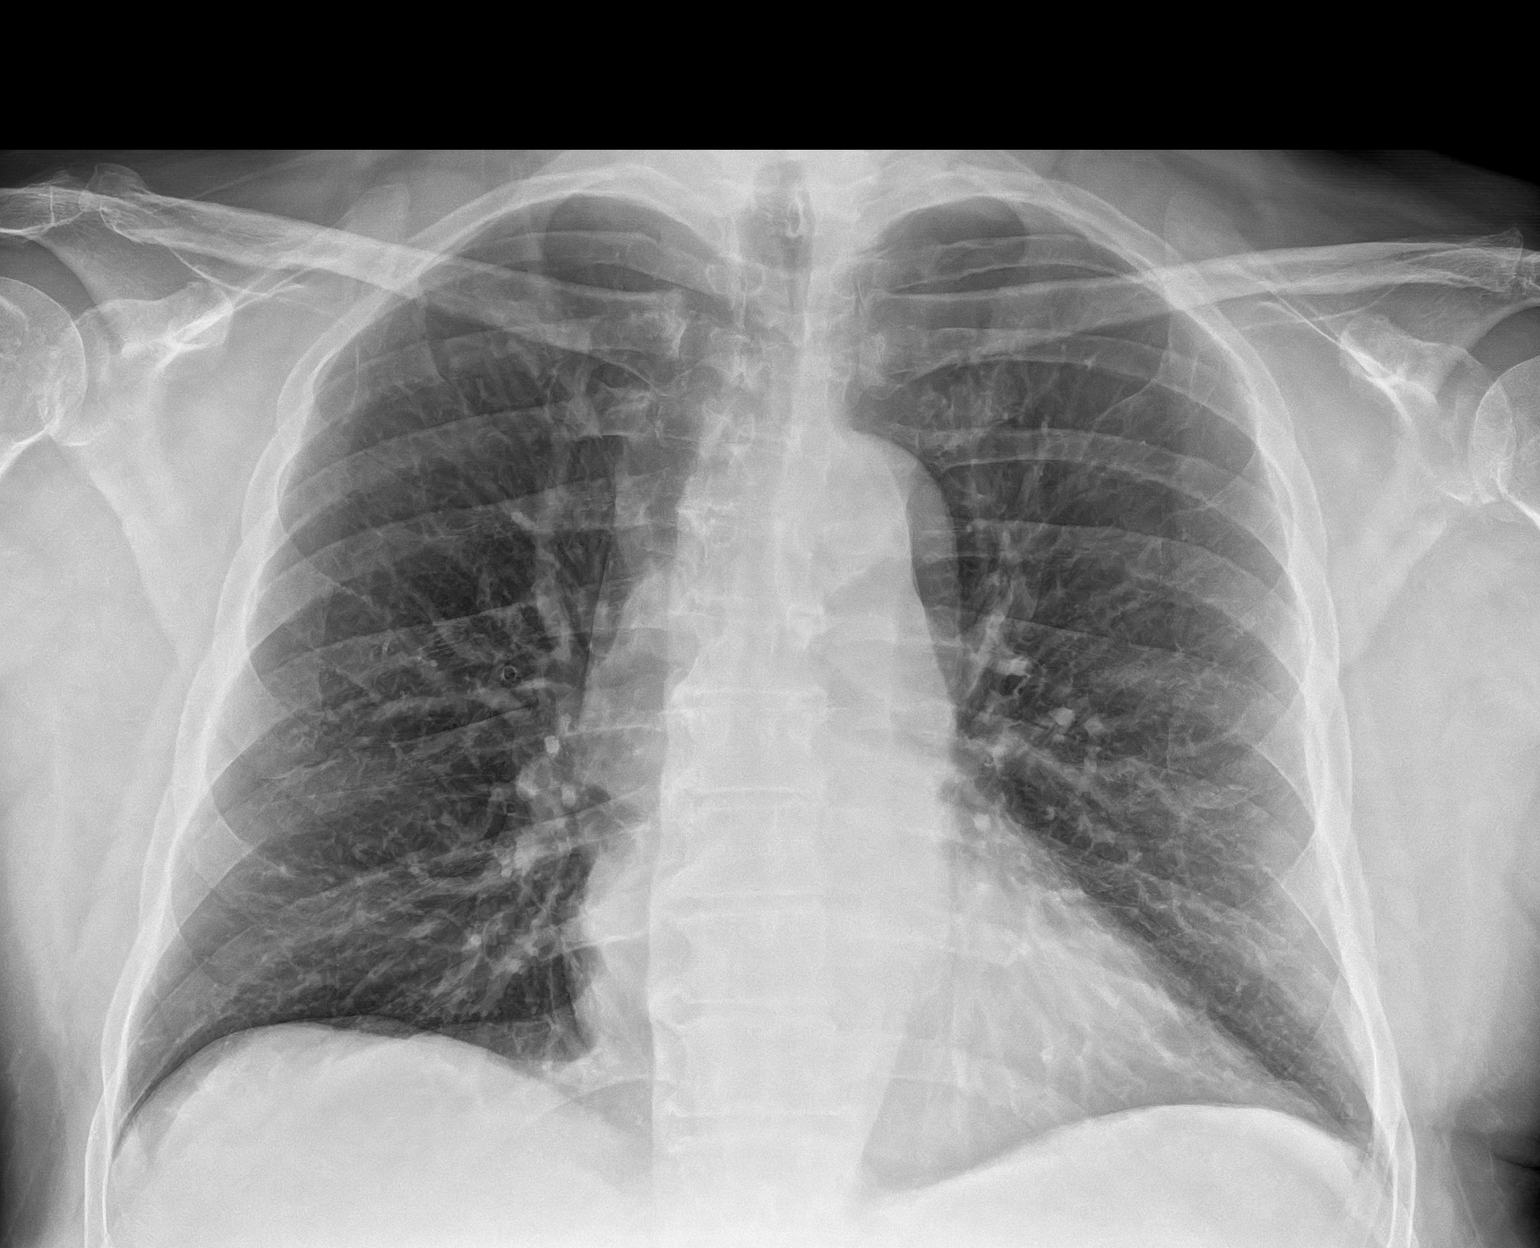

[1 of 1 positions shown; findings below may reference images not displayed]

FINDINGS: Heart size and pulmonary vascularity are normal. No airspace disease
or consolidation in the lungs. No pleural effusions. No
pneumothorax. Mediastinal contours appear intact.
IMPRESSION: No active disease.

## 2022-09-23 ENCOUNTER — Encounter (HOSPITAL_COMMUNITY): Payer: Self-pay | Admitting: Emergency Medicine

## 2022-09-23 ENCOUNTER — Other Ambulatory Visit: Payer: Self-pay

## 2022-09-23 ENCOUNTER — Emergency Department (HOSPITAL_COMMUNITY): Payer: Medicare Other

## 2022-09-23 ENCOUNTER — Observation Stay (HOSPITAL_BASED_OUTPATIENT_CLINIC_OR_DEPARTMENT_OTHER): Payer: Medicare Other

## 2022-09-23 ENCOUNTER — Observation Stay (HOSPITAL_COMMUNITY)
Admission: EM | Admit: 2022-09-23 | Discharge: 2022-09-24 | Disposition: A | Discharge status: Home Health | Payer: Medicare Other | Attending: Family Medicine | Admitting: Family Medicine

## 2022-09-23 DIAGNOSIS — Z87891 Personal history of nicotine dependence: Secondary | ICD-10-CM | POA: Diagnosis not present

## 2022-09-23 DIAGNOSIS — I639 Cerebral infarction, unspecified: Secondary | ICD-10-CM | POA: Diagnosis not present

## 2022-09-23 DIAGNOSIS — J9811 Atelectasis: Secondary | ICD-10-CM | POA: Insufficient documentation

## 2022-09-23 DIAGNOSIS — I6389 Other cerebral infarction: Secondary | ICD-10-CM | POA: Diagnosis not present

## 2022-09-23 DIAGNOSIS — E1121 Type 2 diabetes mellitus with diabetic nephropathy: Secondary | ICD-10-CM | POA: Insufficient documentation

## 2022-09-23 DIAGNOSIS — E7800 Pure hypercholesterolemia, unspecified: Secondary | ICD-10-CM | POA: Insufficient documentation

## 2022-09-23 DIAGNOSIS — Z79899 Other long term (current) drug therapy: Secondary | ICD-10-CM | POA: Diagnosis not present

## 2022-09-23 DIAGNOSIS — Z96652 Presence of left artificial knee joint: Secondary | ICD-10-CM | POA: Insufficient documentation

## 2022-09-23 DIAGNOSIS — E1122 Type 2 diabetes mellitus with diabetic chronic kidney disease: Secondary | ICD-10-CM | POA: Diagnosis not present

## 2022-09-23 DIAGNOSIS — J449 Chronic obstructive pulmonary disease, unspecified: Secondary | ICD-10-CM | POA: Diagnosis not present

## 2022-09-23 DIAGNOSIS — R42 Dizziness and giddiness: Secondary | ICD-10-CM | POA: Insufficient documentation

## 2022-09-23 DIAGNOSIS — R29898 Other symptoms and signs involving the musculoskeletal system: Secondary | ICD-10-CM

## 2022-09-23 DIAGNOSIS — I6523 Occlusion and stenosis of bilateral carotid arteries: Secondary | ICD-10-CM | POA: Diagnosis not present

## 2022-09-23 DIAGNOSIS — I10 Essential (primary) hypertension: Secondary | ICD-10-CM

## 2022-09-23 DIAGNOSIS — Z7984 Long term (current) use of oral hypoglycemic drugs: Secondary | ICD-10-CM | POA: Diagnosis not present

## 2022-09-23 DIAGNOSIS — R531 Weakness: Secondary | ICD-10-CM | POA: Insufficient documentation

## 2022-09-23 DIAGNOSIS — R2990 Unspecified symptoms and signs involving the nervous system: Secondary | ICD-10-CM | POA: Diagnosis not present

## 2022-09-23 DIAGNOSIS — I3481 Nonrheumatic mitral (valve) annulus calcification: Secondary | ICD-10-CM | POA: Diagnosis not present

## 2022-09-23 DIAGNOSIS — Z7982 Long term (current) use of aspirin: Secondary | ICD-10-CM | POA: Diagnosis not present

## 2022-09-23 DIAGNOSIS — E119 Type 2 diabetes mellitus without complications: Secondary | ICD-10-CM

## 2022-09-23 DIAGNOSIS — I700 Atherosclerosis of aorta: Secondary | ICD-10-CM | POA: Diagnosis not present

## 2022-09-23 DIAGNOSIS — I129 Hypertensive chronic kidney disease with stage 1 through stage 4 chronic kidney disease, or unspecified chronic kidney disease: Secondary | ICD-10-CM | POA: Insufficient documentation

## 2022-09-23 DIAGNOSIS — R29810 Facial weakness: Secondary | ICD-10-CM | POA: Diagnosis not present

## 2022-09-23 DIAGNOSIS — N184 Chronic kidney disease, stage 4 (severe): Secondary | ICD-10-CM | POA: Insufficient documentation

## 2022-09-23 LAB — URINALYSIS, ROUTINE W REFLEX MICROSCOPIC
Bilirubin Urine: NEGATIVE
Glucose, UA: NEGATIVE mg/dL
Hgb urine dipstick: NEGATIVE
Ketones, ur: NEGATIVE mg/dL
Leukocytes,Ua: NEGATIVE
Nitrite: NEGATIVE
Protein, ur: NEGATIVE mg/dL
Specific Gravity, Urine: 1.021 (ref 1.005–1.030)
pH: 7 (ref 5.0–8.0)

## 2022-09-23 LAB — ECHOCARDIOGRAM COMPLETE
AR max vel: 2.26 cm2
AV Area VTI: 2.42 cm2
AV Area mean vel: 2.38 cm2
AV Mean grad: 6 mmHg
AV Peak grad: 11.7 mmHg
Ao pk vel: 1.71 m/s
Area-P 1/2: 3.34 cm2
MV VTI: 2.6 cm2
S' Lateral: 2.8 cm

## 2022-09-23 LAB — I-STAT CHEM 8, ED
BUN: 27 mg/dL — ABNORMAL HIGH (ref 8–23)
Calcium, Ion: 1.16 mmol/L (ref 1.15–1.40)
Chloride: 100 mmol/L (ref 98–111)
Creatinine, Ser: 2.4 mg/dL — ABNORMAL HIGH (ref 0.61–1.24)
Glucose, Bld: 186 mg/dL — ABNORMAL HIGH (ref 70–99)
HCT: 38 % — ABNORMAL LOW (ref 39.0–52.0)
Hemoglobin: 12.9 g/dL — ABNORMAL LOW (ref 13.0–17.0)
Potassium: 4.5 mmol/L (ref 3.5–5.1)
Sodium: 136 mmol/L (ref 135–145)
TCO2: 25 mmol/L (ref 22–32)

## 2022-09-23 LAB — DIFFERENTIAL
Abs Immature Granulocytes: 0.03 10*3/uL (ref 0.00–0.07)
Basophils Absolute: 0.1 10*3/uL (ref 0.0–0.1)
Basophils Relative: 1 %
Eosinophils Absolute: 0.1 10*3/uL (ref 0.0–0.5)
Eosinophils Relative: 2 %
Immature Granulocytes: 0 %
Lymphocytes Relative: 11 %
Lymphs Abs: 0.9 10*3/uL (ref 0.7–4.0)
Monocytes Absolute: 0.5 10*3/uL (ref 0.1–1.0)
Monocytes Relative: 6 %
Neutro Abs: 6.8 10*3/uL (ref 1.7–7.7)
Neutrophils Relative %: 80 %

## 2022-09-23 LAB — CBC
HCT: 36.8 % — ABNORMAL LOW (ref 39.0–52.0)
Hemoglobin: 11.9 g/dL — ABNORMAL LOW (ref 13.0–17.0)
MCH: 29.5 pg (ref 26.0–34.0)
MCHC: 32.3 g/dL (ref 30.0–36.0)
MCV: 91.1 fL (ref 80.0–100.0)
Platelets: 262 10*3/uL (ref 150–400)
RBC: 4.04 MIL/uL — ABNORMAL LOW (ref 4.22–5.81)
RDW: 13.9 % (ref 11.5–15.5)
WBC: 8.4 10*3/uL (ref 4.0–10.5)
nRBC: 0 % (ref 0.0–0.2)

## 2022-09-23 LAB — TSH: TSH: 2.331 u[IU]/mL (ref 0.350–4.500)

## 2022-09-23 LAB — PROTIME-INR
INR: 1 (ref 0.8–1.2)
Prothrombin Time: 13.4 seconds (ref 11.4–15.2)

## 2022-09-23 LAB — COMPREHENSIVE METABOLIC PANEL
ALT: 49 U/L — ABNORMAL HIGH (ref 0–44)
AST: 29 U/L (ref 15–41)
Albumin: 3.6 g/dL (ref 3.5–5.0)
Alkaline Phosphatase: 64 U/L (ref 38–126)
Anion gap: 10 (ref 5–15)
BUN: 29 mg/dL — ABNORMAL HIGH (ref 8–23)
CO2: 25 mmol/L (ref 22–32)
Calcium: 9.3 mg/dL (ref 8.9–10.3)
Chloride: 101 mmol/L (ref 98–111)
Creatinine, Ser: 2.23 mg/dL — ABNORMAL HIGH (ref 0.61–1.24)
GFR, Estimated: 30 mL/min — ABNORMAL LOW (ref >60)
Glucose, Bld: 188 mg/dL — ABNORMAL HIGH (ref 70–99)
Potassium: 4.6 mmol/L (ref 3.5–5.1)
Sodium: 136 mmol/L (ref 135–145)
Total Bilirubin: 1 mg/dL (ref 0.3–1.2)
Total Protein: 7.4 g/dL (ref 6.5–8.1)

## 2022-09-23 LAB — GLUCOSE, CAPILLARY
Glucose-Capillary: 176 mg/dL — ABNORMAL HIGH (ref 70–99)
Glucose-Capillary: 88 mg/dL (ref 70–99)
Glucose-Capillary: 84 mg/dL (ref 70–99)

## 2022-09-23 LAB — RAPID URINE DRUG SCREEN, HOSP PERFORMED
Amphetamines: NOT DETECTED
Barbiturates: NOT DETECTED
Benzodiazepines: NOT DETECTED
Cocaine: NOT DETECTED
Opiates: NOT DETECTED
Tetrahydrocannabinol: NOT DETECTED

## 2022-09-23 LAB — APTT: aPTT: 26 seconds (ref 24–36)

## 2022-09-23 LAB — LIPID PANEL
Cholesterol: 149 mg/dL (ref 0–200)
HDL: 50 mg/dL (ref >40)
LDL Cholesterol: 88 mg/dL (ref 0–99)
Total CHOL/HDL Ratio: 3 RATIO
Triglycerides: 53 mg/dL (ref <150)
VLDL: 11 mg/dL (ref 0–40)

## 2022-09-23 LAB — MAGNESIUM: Magnesium: 1.9 mg/dL (ref 1.7–2.4)

## 2022-09-23 LAB — PHOSPHORUS: Phosphorus: 2.7 mg/dL (ref 2.5–4.6)

## 2022-09-23 LAB — HEMOGLOBIN A1C
Hgb A1c MFr Bld: 6.7 % — ABNORMAL HIGH (ref 4.8–5.6)
Mean Plasma Glucose: 145.59 mg/dL

## 2022-09-23 LAB — ETHANOL: Alcohol, Ethyl (B): <10 mg/dL (ref <10)

## 2022-09-23 LAB — VITAMIN B12: Vitamin B-12: 597 pg/mL (ref 180–914)

## 2022-09-23 LAB — CBG MONITORING, ED: Glucose-Capillary: 161 mg/dL — ABNORMAL HIGH (ref 70–99)

## 2022-09-23 MED ORDER — SODIUM CHLORIDE 0.9 % IV BOLUS
1000.0000 mL | Freq: Once | INTRAVENOUS | Status: AC
  Administered 2022-09-23: 1000 mL via INTRAVENOUS

## 2022-09-23 MED ORDER — SODIUM CHLORIDE 0.9% FLUSH
3.0000 mL | INTRAVENOUS | Status: DC | PRN
  Administered 2022-09-23: 3 mL via INTRAVENOUS

## 2022-09-23 MED ORDER — ONDANSETRON HCL 4 MG/2ML IJ SOLN
INTRAMUSCULAR | Status: AC
  Administered 2022-09-23: 4 mg via INTRAVENOUS
  Filled 2022-09-23: qty 2

## 2022-09-23 MED ORDER — SODIUM CHLORIDE 0.9 % IV SOLN: 250.0000 mL | INTRAVENOUS | Status: DC | PRN

## 2022-09-23 MED ORDER — ACETAMINOPHEN 650 MG RE SUPP: 650.0000 mg | Freq: Four times a day (QID) | RECTAL | Status: DC | PRN

## 2022-09-23 MED ORDER — MECLIZINE HCL 12.5 MG PO TABS
25.0000 mg | ORAL_TABLET | Freq: Once | ORAL | Status: AC
  Administered 2022-09-23: 25 mg via ORAL
  Filled 2022-09-23: qty 2

## 2022-09-23 MED ORDER — UMECLIDINIUM BROMIDE 62.5 MCG/ACT IN AEPB
1.0000 | INHALATION_SPRAY | Freq: Every day | RESPIRATORY_TRACT | Status: DC
  Administered 2022-09-24: 1 via RESPIRATORY_TRACT
  Filled 2022-09-23: qty 7

## 2022-09-23 MED ORDER — ATORVASTATIN CALCIUM 40 MG PO TABS
80.0000 mg | ORAL_TABLET | Freq: Every day | ORAL | Status: DC
  Administered 2022-09-23 – 2022-09-24 (×2): 80 mg via ORAL
  Filled 2022-09-23 (×2): qty 2

## 2022-09-23 MED ORDER — ADULT MULTIVITAMIN W/MINERALS CH
1.0000 | ORAL_TABLET | Freq: Every day | ORAL | Status: DC
  Administered 2022-09-23 – 2022-09-24 (×2): 1 via ORAL
  Filled 2022-09-23 (×3): qty 1

## 2022-09-23 MED ORDER — FLUTICASONE FUROATE-VILANTEROL 100-25 MCG/ACT IN AEPB
1.0000 | INHALATION_SPRAY | Freq: Every day | RESPIRATORY_TRACT | Status: DC
  Administered 2022-09-24: 1 via RESPIRATORY_TRACT
  Filled 2022-09-23: qty 28

## 2022-09-23 MED ORDER — DILTIAZEM HCL ER COATED BEADS 180 MG PO CP24
180.0000 mg | ORAL_CAPSULE | Freq: Every day | ORAL | Status: DC
  Administered 2022-09-23 – 2022-09-24 (×2): 180 mg via ORAL
  Filled 2022-09-23 (×2): qty 1

## 2022-09-23 MED ORDER — SODIUM CHLORIDE 0.9 % IV SOLN: INTRAVENOUS | Status: AC

## 2022-09-23 MED ORDER — LEVALBUTEROL HCL 0.63 MG/3ML IN NEBU: 0.6300 mg | INHALATION_SOLUTION | Freq: Four times a day (QID) | RESPIRATORY_TRACT | Status: DC | PRN

## 2022-09-23 MED ORDER — IPRATROPIUM BROMIDE 0.02 % IN SOLN: 0.5000 mg | Freq: Four times a day (QID) | RESPIRATORY_TRACT | Status: DC | PRN

## 2022-09-23 MED ORDER — SODIUM CHLORIDE 0.9% FLUSH
3.0000 mL | Freq: Two times a day (BID) | INTRAVENOUS | Status: DC
  Administered 2022-09-23 (×2): 3 mL via INTRAVENOUS

## 2022-09-23 MED ORDER — ASPIRIN 81 MG PO CHEW
81.0000 mg | CHEWABLE_TABLET | Freq: Every day | ORAL | Status: DC
  Administered 2022-09-24: 81 mg via ORAL
  Filled 2022-09-23: qty 1

## 2022-09-23 MED ORDER — TRAZODONE HCL 50 MG PO TABS
25.0000 mg | ORAL_TABLET | Freq: Every evening | ORAL | Status: DC | PRN
  Filled 2022-09-23: qty 1

## 2022-09-23 MED ORDER — ONDANSETRON HCL 4 MG/2ML IJ SOLN: 4.0000 mg | Freq: Four times a day (QID) | INTRAMUSCULAR | Status: DC | PRN

## 2022-09-23 MED ORDER — INSULIN ASPART 100 UNIT/ML IJ SOLN
0.0000 [IU] | Freq: Three times a day (TID) | INTRAMUSCULAR | Status: DC
  Administered 2022-09-23: 1 [IU] via SUBCUTANEOUS

## 2022-09-23 MED ORDER — GLIMEPIRIDE 2 MG PO TABS
2.0000 mg | ORAL_TABLET | Freq: Every day | ORAL | Status: DC
  Filled 2022-09-23 (×5): qty 1

## 2022-09-23 MED ORDER — STROKE: EARLY STAGES OF RECOVERY BOOK
Freq: Once | Status: AC
  Filled 2022-09-23: qty 1

## 2022-09-23 MED ORDER — ONDANSETRON HCL 4 MG PO TABS: 4.0000 mg | ORAL_TABLET | Freq: Four times a day (QID) | ORAL | Status: DC | PRN

## 2022-09-23 MED ORDER — HEPARIN SODIUM (PORCINE) 5000 UNIT/ML IJ SOLN
5000.0000 [IU] | Freq: Three times a day (TID) | INTRAMUSCULAR | Status: DC
  Filled 2022-09-23: qty 1

## 2022-09-23 MED ORDER — BISACODYL 5 MG PO TBEC: 5.0000 mg | DELAYED_RELEASE_TABLET | Freq: Every day | ORAL | Status: DC | PRN

## 2022-09-23 MED ORDER — HYDRALAZINE HCL 20 MG/ML IJ SOLN
10.0000 mg | INTRAMUSCULAR | Status: DC | PRN
  Administered 2022-09-23: 10 mg via INTRAVENOUS
  Filled 2022-09-23: qty 1

## 2022-09-23 MED ORDER — SODIUM CHLORIDE 0.9 % IV SOLN: INTRAVENOUS | Status: DC

## 2022-09-23 MED ORDER — ONDANSETRON HCL 4 MG/2ML IJ SOLN
INTRAMUSCULAR | Status: AC
  Filled 2022-09-23: qty 2

## 2022-09-23 MED ORDER — ONDANSETRON HCL 4 MG/2ML IJ SOLN: 4.0000 mg | Freq: Once | INTRAMUSCULAR | Status: AC

## 2022-09-23 MED ORDER — ASPIRIN 325 MG PO TABS
325.0000 mg | ORAL_TABLET | Freq: Once | ORAL | Status: AC
  Administered 2022-09-23: 325 mg via ORAL
  Filled 2022-09-23 (×2): qty 1

## 2022-09-23 MED ORDER — ACETAMINOPHEN 325 MG PO TABS
650.0000 mg | ORAL_TABLET | Freq: Four times a day (QID) | ORAL | Status: DC | PRN
  Filled 2022-09-23: qty 2

## 2022-09-23 MED ORDER — MECLIZINE HCL 12.5 MG PO TABS: 25.0000 mg | ORAL_TABLET | Freq: Three times a day (TID) | ORAL | Status: DC | PRN

## 2022-09-23 MED ORDER — SENNOSIDES-DOCUSATE SODIUM 8.6-50 MG PO TABS: 1.0000 | ORAL_TABLET | Freq: Every evening | ORAL | Status: DC | PRN

## 2022-09-23 MED ORDER — IOHEXOL 350 MG/ML SOLN
75.0000 mL | Freq: Once | INTRAVENOUS | Status: AC | PRN
  Administered 2022-09-23: 100 mL via INTRAVENOUS

## 2022-09-23 MED ORDER — FLEET ENEMA 7-19 GM/118ML RE ENEM: 1.0000 | ENEMA | Freq: Once | RECTAL | Status: DC | PRN

## 2022-09-23 MED ORDER — OXYCODONE HCL 5 MG PO TABS: 5.0000 mg | ORAL_TABLET | ORAL | Status: DC | PRN

## 2022-09-23 MED ORDER — HYDROMORPHONE HCL 1 MG/ML IJ SOLN: 0.5000 mg | INTRAMUSCULAR | Status: DC | PRN

## 2022-09-23 MED ORDER — SODIUM CHLORIDE 0.9% FLUSH
3.0000 mL | Freq: Two times a day (BID) | INTRAVENOUS | Status: DC
  Administered 2022-09-23 – 2022-09-24 (×3): 3 mL via INTRAVENOUS

## 2022-09-23 NOTE — Progress Notes (Signed)
*  PRELIMINARY RESULTS* Echocardiogram 2D Echocardiogram has been performed.  Rick [name] 09/23/2022, 11:04 AM

## 2022-09-23 NOTE — ED Triage Notes (Signed)
Pt reports upon waking at 0230 this morning he was dizzy and nauseas. Pt reports he did vomit x 1. PT reports he laid back down and got up again at 0400 and had a headache and dizziness. Denies unilateral weakness in upper or lower extremities. No facial droop. PT is alert and oriented x 4.

## 2022-09-23 NOTE — Progress Notes (Signed)
Code Stroke Time  830 Call time 835 Exam started 851 exam finished 851 Images sent to [place] Exam completed in [facility] called

## 2022-09-23 NOTE — Assessment & Plan Note (Signed)
-   Not O2 dependent at baseline, on trilogy Ellipta 100- 62.5-25 mg, not on formulary, will substitute -As needed DuoNeb bronchodilators

## 2022-09-23 NOTE — Consult Note (Signed)
Code stroke activated at [phone]. Pt in CT.   Dr Quinn Axe with neuro paged at [phone]. Stroke cart being taken to CT at this time.   Dr Quinn Axe on camera for report and neuro exam at [phone].  LKW 2230. mRS 0. Woke at 0200 with dizziness, HA, LLE weakness reported while in ED around 0830.   Returned to room from [facility] at 0910.  No LVO per Dr Quinn Axe and dismissed from cart at [phone].  Biagio Borg, Telestroke RN

## 2022-09-23 NOTE — Progress Notes (Signed)
SLP Cancellation Note  Patient Details Name: Rick [name] MRN: [id number] DOB: 20-Dec-1945   Cancelled treatment:       Reason Eval/Treat Not Completed: SLP screened, no needs identified. Pt's cognition, speech and language are functioning at baseline. Thank you for this referral, our service will sign off at this time,  Ezequiel Macauley H. Roddie Mc, CCC-SLP Speech Language Pathologist    Wende Bushy 09/23/2022, 3:32 PM

## 2022-09-23 NOTE — H&P (Signed)
History and Physical   Patient: Rick [name]                            PCP: Asencion Noble, MD                    DOB: 1946-11-02            DOA: 09/23/2022 DGL:[id number]             DOS: 09/23/2022, 10:23 AM  Asencion Noble, MD  Patient coming from:   HOME  I have personally reviewed patient's medical records, in electronic medical records, including:  [place] [facility], and care everywhere.    Chief Complaint:   Chief Complaint  Patient presents with   Dizziness    History of present illness:    Rick [name] is a 76 year old male with a history of atherosclerosis of aorta, DM 2, HTN, HLD, CKD stage III, benign kidney tumor, lateral ventral hernia low back pain, ED, presented with symptoms of vertigo started about 2 AM this morning, and left leg weakness and headache in the ED this AM.  He describes the vertigo as room spinning around him, vertigo gets worse with movement causes significant nausea but no vomiting, headaches feeling"off" since the symptoms started denies any history of vertigo or strokes in the past In ED he developed more headache and left leg weakness around 8:30 AM   Admits that he has a history of several spinal surgeries denies any chronic asymmetric weaknesses.  Denies of having any visual changes.  ED:  Vitals:   09/23/22 0817 09/23/22 0930  BP: (!) 172/77 (!) 149/70  Pulse: 72 63  Resp: 18 15  SpO2: 100% 97%   CBC hemoglobin 11.9, BUN 29, creatinine 2.23, ALT 49, GFR 30, BUN 27, EKG normal sinus rhythm rate of 67. CT of the head/CT of head and neck reviewed questionable MCA lesion versus artifact on CT of the head, CTA report negative for any occlusions or abnormalities  Code stroke was called, teleneurology Dr. Quinn Axe has evaluated the patient Recommending patient to be admitted to [facility], MRI in a.m., can continue with stroke work-up....      Patient Denies having: Fever, Chills, Cough, SOB, Chest Pain, Abd pain, N/V/D, headache, lightheadedness,   Dysuria, Joint pain, rash, open wounds  ED Course:   Blood pressure (!) 149/70, pulse 63, resp. rate 15, SpO2 97 %. Abnormal labs;   Review of Systems: As per HPI, otherwise 10 point review of systems were negative.   ----------------------------------------------------------------------------------------------------------------------  No Known Allergies  Home MEDs:  Prior to Admission medications   Medication Sig Start Date End Date Taking? Authorizing Provider  atorvastatin (LIPITOR) 40 MG tablet Take 40 mg by mouth daily.   Yes [provider]  diltiazem (CARDIZEM CD) 240 MG 24 hr capsule Take 240 mg by mouth daily. 08/22/22  Yes [provider]  glimepiride (AMARYL) 2 MG tablet Take 2 mg by mouth every morning. 06/19/21  Yes [provider]  JANUVIA 100 MG tablet Take 50 mg by mouth daily.  08/16/16  Yes [provider]  lisinopril-hydrochlorothiazide (ZESTORETIC) 20-12.5 MG tablet Take 2 tablets by mouth daily. 10/24/19  Yes [provider]  metFORMIN (GLUCOPHAGE) 500 MG tablet Take 1,000 mg by mouth daily.    Yes [provider]  Multiple Vitamin (MULTIVITAMIN WITH MINERALS) TABS tablet Take 1 tablet by mouth daily.   Yes [provider]  TRELEGY ELLIPTA 100-62.5-25 MCG/INH AEPB Inhale 1 puff into the lungs daily. 10/24/19  Yes [provider]    PRN MEDs: sodium chloride, acetaminophen **OR** acetaminophen, bisacodyl, hydrALAZINE, HYDROmorphone (DILAUDID) injection, ipratropium, levalbuterol, ondansetron, ondansetron **OR** ondansetron (ZOFRAN) IV, oxyCODONE, senna-docusate, sodium chloride flush, sodium phosphate, traZODone  Past Medical History:  Diagnosis Date   Arthritis    Atherosclerosis of aorta (HCC)    Chronic obstructive pulmonary disease, unspecified (HCC)    CKD (chronic kidney disease) stage 3, GFR 30-59 ml/min (HCC)    Essential hypertension    Hiatal hernia    Hyperlipidemia    Kidney tumor  (benign)    Reportedly evaluated at [facility]   Lateral ventral hernia    Low back pain    Male erectile disorder    Type 2 diabetes mellitus ([place])     Past Surgical History:  Procedure Laterality Date   BACK SURGERY     INGUINAL HERNIA REPAIR  1974   MUSCLE REPAIR     Right shoulder by Dr. Mayer Camel    REPLACEMENT TOTAL KNEE Left    TONSILLECTOMY       reports that he quit smoking about 23 years ago. His smoking use included cigarettes. He has a 45.00 pack-year smoking history. He has never used smokeless tobacco. He reports current alcohol use of about 24.0 standard drinks of alcohol per week. He reports that he does not use drugs.   Family History  Problem Relation Age of Onset   Stroke Mother    Heart attack Father    Heart attack Maternal Grandmother    Heart failure Maternal Grandfather    Heart attack Paternal Grandmother    Heart attack Paternal Grandfather     Physical Exam:   Vitals:   09/23/22 0817 09/23/22 0930  BP: (!) 172/77 (!) 149/70  Pulse: 72 63  Resp: 18 15  SpO2: 100% 97%   Constitutional: NAD, calm, comfortable, subjective generalized headaches Eyes: PERRL, lids and conjunctivae normal ENMT: Mucous membranes are moist. Posterior pharynx clear of any exudate or lesions.Normal dentition.  Neck: normal, supple, no masses, no thyromegaly Respiratory: clear to auscultation bilaterally, no wheezing, no crackles. Normal respiratory effort. No accessory muscle use.  Cardiovascular: Regular rate and rhythm, no murmurs / rubs / gallops. No extremity edema. 2+ pedal pulses. No carotid bruits.  Abdomen: no tenderness, no masses palpated. No hepatosplenomegaly. Bowel sounds positive.  Musculoskeletal: Left lower extremity weakness 3/5, otherwise exam within normal limits  no clubbing / cyanosis. No joint deformity upper and lower extremities. Good ROM, no contractures. Neurologic: No significant focal neurological finding a separate subjective dizziness with  movement  mild left leg weakness 4/5 otherwise  CN II-XII grossly intact. Sensation intact, DTR normal. Strength 5/5 in all other 3 extremities.  Psychiatric: Normal judgment and insight. Alert and oriented x 3. Normal mood.  Skin: no rashes, lesions, ulcers. No induration Decubitus/ulcers:  Wounds: per nursing documentation         Labs on admission:    I have personally reviewed following labs and imaging studies  CBC: Recent Labs  Lab 09/23/22 0830 09/23/22 0848  WBC 8.4  --   NEUTROABS 6.8  --   HGB 11.9* 12.9*  HCT 36.8* 38.0*  MCV 91.1  --   PLT 262  --    Basic Metabolic Panel: Recent Labs  Lab 09/23/22 0830 09/23/22 0848  NA 136 136  K 4.6 4.5  CL 101 100  CO2 25  --   GLUCOSE  188* 186*  BUN 29* 27*  CREATININE 2.23* 2.40*  CALCIUM 9.3  --    GFR: CrCl cannot be calculated (Unknown ideal weight.). Liver Function Tests: Recent Labs  Lab 09/23/22 0830  AST 29  ALT 49*  ALKPHOS 64  BILITOT 1.0  PROT 7.4  ALBUMIN 3.6   No results for input(s): "LIPASE", "AMYLASE" in the last 168 hours. No results for input(s): "AMMONIA" in the last 168 hours. Coagulation Profile: Recent Labs  Lab 09/23/22 0830  INR 1.0    Recent Labs  Lab 09/23/22 0815  GLUCAP 161*    Urine analysis:    Component Value Date/Time   COLORURINE YELLOW 09/23/2022 0830   APPEARANCEUR CLEAR 09/23/2022 0830   LABSPEC 1.021 09/23/2022 0830   PHURINE 7.0 09/23/2022 0830   GLUCOSEU NEGATIVE 09/23/2022 0830   HGBUR NEGATIVE 09/23/2022 0830   BILIRUBINUR NEGATIVE 09/23/2022 0830   KETONESUR NEGATIVE 09/23/2022 0830   PROTEINUR NEGATIVE 09/23/2022 0830   UROBILINOGEN 0.2 11/30/2009 0958   NITRITE NEGATIVE 09/23/2022 0830   LEUKOCYTESUR NEGATIVE 09/23/2022 0830    Last A1C:  Lab Results  Component Value Date   HGBA1C 7.2 07/14/2019     Radiologic Exams on Admission:   CT ANGIO HEAD NECK W WO CM W PERF (CODE STROKE)  Result Date: 09/23/2022 CLINICAL DATA:   76 year old male code stroke presentation. Left leg weakness and questionable hyperdenseright MCA on plain head CT. EXAM: CT ANGIOGRAPHY HEAD AND NECK CT PERFUSION BRAIN TECHNIQUE: Multidetector CT imaging of the head and neck was performed using the standard protocol during bolus administration of intravenous contrast. Multiplanar CT image reconstructions and MIPs were obtained to evaluate the vascular anatomy. Carotid stenosis measurements (when applicable) are obtained utilizing NASCET criteria, using the distal internal carotid diameter as the denominator. Multiphase CT imaging of the brain was performed following IV bolus contrast injection. Subsequent parametric perfusion maps were calculated using RAPID software. RADIATION DOSE REDUCTION: This exam was performed according to the departmental dose-optimization program which includes automated exposure control, adjustment of the mA and/or kV according to patient size and/or use of iterative reconstruction technique. CONTRAST:  163m OMNIPAQUE IOHEXOL 350 MG/ML SOLN COMPARISON:  Plain head CT 0857 hours today. FINDINGS: CT Brain Perfusion Findings: ASPECTS: Ten CBF (<30%) Volume: 052mPerfusion (Tmax>6.0s) volume: 40m48mismatch Volume: Not applicable Infarction Location:Not applicable CTA NECK Skeleton: Widespread cervical spine degeneration. Chronic appearing mild T3 compression fracture. No acute osseous abnormality identified. Upper chest: Mild atelectasis. Negative visible superior mediastinum. Other neck: Glottis is closed. No acute soft tissue finding identified in the neck. Aortic arch: Calcified aortic atherosclerosis. 3 vessel arch configuration. Right carotid system: Tortuous brachiocephalic artery without stenosis. Calcified plaque at the right CCA origin but no stenosis. Additional calcified plaque approaching the right carotid bifurcation and moderate calcified plaque at the right ICA medial origin and posterior bulb. Up to 50 % stenosis with respect  to the distal vessel. The right ICA remains patent to the skull base. Left carotid system: Mild tortuosity. Soft and calcified plaque in the anterior left CCA before the bifurcation without stenosis. Mild calcified plaque mostly at the left ICA bulb without stenosis. Vertebral arteries: Mild calcified plaque at the right subclavian artery origin without stenosis. Normal right vertebral artery origin. Late entry of the right vertebral artery into the cervical transverse foramen, but otherwise patent and normal to the skull base. Mild tortuosity and calcified plaque in the proximal left subclavian artery without stenosis. Normal left vertebral artery origin. Codominant left vertebral artery  appears patent and normal to the skull base. CTA HEAD Posterior circulation: Normal distal vertebral arteries and vertebrobasilar junction. Diminutive or absent PICA. Patent basilar artery to the tip without stenosis. Patent SCA and PCA origins. Posterior communicating arteries are diminutive or absent. Bilateral PCA branches are within normal limits. Anterior circulation: Both ICA siphons are patent. Mild siphon ectasia on the left. Mild left and mild to moderate right ICA siphon calcified plaque but no hemodynamically significant siphon stenosis. Patent carotid termini, MCA and ACA origins. Diminutive or absent anterior communicating artery. Bilateral ACA branches are within normal limits. Left MCA M1 segment bifurcates early without stenosis. Right MCA M1 segment and right MCA trifurcation are patent without stenosis. Bilateral MCA branches are within normal limits. Venous sinuses: Patent. Anatomic variants: None significant. Review of the MIP images confirms the above findings Preliminary report discussed by telephone with Neurology Dr. Quinn Axe on 09/23/2022 at 0914 hours. IMPRESSION: 1. Negative for large vessel occlusion.  Negative CT Perfusion. 2. Bilateral cervical carotid and ICA siphon mostly calcified atherosclerosis. But  no hemodynamically significant stenosis. 3. Vertebral arteries and posterior circulation appear negative. 4. Chronic appearing mild T3 compression fracture. 5. Aortic Atherosclerosis (ICD10-I70.0). Electronically Signed   By: Genevie Ann M.D.   On: 09/23/2022 09:21   CT HEAD CODE STROKE WO CONTRAST  Result Date: 09/23/2022 CLINICAL DATA:  Code stroke. 76 year old male. Headache, vertigo, left lower extremity weakness. EXAM: CT HEAD WITHOUT CONTRAST TECHNIQUE: Contiguous axial images were obtained from the base of the skull through the vertex without intravenous contrast. RADIATION DOSE REDUCTION: This exam was performed according to the departmental dose-optimization program which includes automated exposure control, adjustment of the mA and/or kV according to patient size and/or use of iterative reconstruction technique. COMPARISON:  None Available. FINDINGS: Brain: Cerebral volume is within normal limits for age. No midline shift, ventriculomegaly, mass effect, evidence of mass lesion, intracranial hemorrhage or evidence of cortically based acute infarction. Gray-white matter differentiation is within normal limits for age. Vascular: Calcified atherosclerosis at the skull base. However a, there is a questionable hyperdense right MCA on sagittal images 21 and 22, but also streak artifact here. Skull: Negative. Sinuses/Orbits: Visualized paranasal sinuses and mastoids are clear. Tympanic cavities appear clear. Other: Visualized orbits and scalp soft tissues are within normal limits. ASPECTS [facility] Stroke Program Early CT Score) Total score (0-10 with 10 being normal): 10 IMPRESSION: Questionable hyperdense Right MCA. But otherwise normal for age noncontrast CT appearance of the brain. ASPECTS is 10. Study discussed by telephone with Dr. Octaviano Glow on 09/23/2022 at 09:01. CTA is pending. Electronically Signed   By: Genevie Ann M.D.   On: 09/23/2022 09:03    EKG:   Independently reviewed.  Orders placed or  performed during the hospital encounter of 09/23/22   EKG 12-Lead   EKG 12-Lead   ED EKG   ED EKG   EKG 12-Lead   ---------------------------------------------------------------------------------------------------------------------------------------    Assessment / Plan:   Principal Problem:   Stroke [facility]) Active Problems:   Vertigo   DMII (diabetes mellitus, type 2) (HCC)   Essential (primary) hypertension   Pure hypercholesterolemia, unspecified   Chronic kidney disease, stage 4 (severe) (HCC)   Chronic obstructive pulmonary disease, unspecified (HCC)   Type 2 diabetes mellitus with diabetic nephropathy (HCC)   Assessment and Plan: * Stroke ([place]) - Symptoms of vertigo, left-sided weakness, -CT scan of the head not convincing yet, pending MRI of the head -Patient has been evaluated by on-call neurologist Dr. Quinn Axe -Will  be admitted for stroke work-up, including MRI of the head in a.m., 2D echocardiogram, labs -Per neurology aspirin and high-dose statin initiated -Continue neurochecks -Continue telemetry monitoring -Patient is on Cardizem but no history of A-fib  Vertigo - Stroke versus BPV -Continue with stroke work-up, CT, CTA thus far negative for any acute findings -Consulting PT for BPV maneuvers -As needed meclizine -No signs of upper respiratory infection including sinusitis or ear infections.  DMII (diabetes mellitus, type 2) (HCC) - Checking CBG QA CHS with SSI coverage -Checking A1c -Holding home meds: Metformin, Januvia, continuing Amaryl  Chronic obstructive pulmonary disease, unspecified (HCC) - Not O2 dependent at baseline, on trilogy Ellipta 100- 62.5-25 mg, not on formulary, will substitute -As needed DuoNeb bronchodilators  Pure hypercholesterolemia, unspecified - On Lipitor 40 mg, will be switched to 80 mg to maximize dose for possible stroke -Checking fasting lipid panel  Essential (primary) hypertension - Hemodynamically stable, but holding  BP medications of lisinopril/HCTZ -Allowing for permissive hypertension    Consults called: Teleneurology Dr. Quinn Axe -------------------------------------------------------------------------------------------------------------------------------------------- DVT prophylaxis:  heparin injection 5,000 Units Start: 09/23/22 1400 TED hose Start: 09/23/22 0949 SCDs Start: 09/23/22 0949   Code Status:   Code Status: Full Code   Admission status: Patient will be admitted as Observation, with a less than 2 midnight length of stay. Level of care: Telemetry   Family Communication:  none at bedside  (The above findings and plan of care has been discussed with patient in detail, the patient expressed understanding and agreement of above plan)  --------------------------------------------------------------------------------------------------------------------------------------------------  Disposition Plan:  Anticipated 1-2 days Status is: Observation The patient remains OBS appropriate and will d/c before 2 midnights.  -------------------------------------------------------------------------------------------------------------------------------------------------  Time spent: > than  3  Min.   SIGNED: Deatra James, MD, FHM. [facility],  Pager (Please use [email or web address] to page to text)  If 7PM-7AM, please contact night-coverage [email or web address],  09/23/2022, 10:23 AM

## 2022-09-23 NOTE — Assessment & Plan Note (Signed)
-   On Lipitor 40 mg, will be switched to 80 mg to maximize dose for possible stroke -Checking fasting lipid panel

## 2022-09-23 NOTE — Assessment & Plan Note (Signed)
-   Stroke versus BPV -Continue with stroke work-up, CT, CTA thus far negative for any acute findings -Consulting PT for BPV maneuvers -As needed meclizine -No signs of upper respiratory infection including sinusitis or ear infections.

## 2022-09-23 NOTE — Consult Note (Signed)
NEUROLOGY TELECONSULTATION NOTE   Date of service: September 23, 2022 Patient Name: Rick [name] MRN:  [id number] DOB:  December 31, 1945 Reason for consult: telestroke  Requesting Provider: Dr. Langston Masker Consult Participants: myself, bedside RN, telestroke RN, patient Location of the provider: [facility] Location of the patient: [facility]  This consult was provided via telemedicine with 2-way video and audio communication. The patient/family was informed that care would be provided in this way and agreed to receive care in this manner.   _ _ _   _ __   _ __ _ _  __ __   _ __   __ _  History of Present Illness   76 year old gentleman with a past medical history significant for COPD, CKD stage III, hypertension, hyperlipidemia, diabetes type 2 who presents with vertigo since 2 AM.  He also has a mild headache and reported left leg weakness which has improved.  NIH stroke scale was a 2 for dysarthria and left facial droop.  CT head showed no acute intracranial process.  Patient is not on anticoagulation.  TNK not administered due to presentation outside the window.  CTA showed no LVO.  CTP showed no perfusion mismatch.  Intervention was therefore not indicated.  NS imaging personally reviewed and discussed by phone with radiology.   ROS   Per HPI; all other systems reviewed and are negative  Past History   The following was personally reviewed:  Past Medical History:  Diagnosis Date   Arthritis    Atherosclerosis of aorta (HCC)    Chronic obstructive pulmonary disease, unspecified (HCC)    CKD (chronic kidney disease) stage 3, GFR 30-59 ml/min (HCC)    Essential hypertension    Hiatal hernia    Hyperlipidemia    Kidney tumor (benign)    Reportedly evaluated at [facility]   Lateral ventral hernia    Low back pain    Male erectile disorder    Type 2 diabetes mellitus ([place])    Past Surgical History:  Procedure Laterality Date   BACK SURGERY     INGUINAL HERNIA REPAIR  1974   MUSCLE REPAIR      Right shoulder by Dr. Mayer Camel    REPLACEMENT TOTAL KNEE Left    TONSILLECTOMY     Family History  Problem Relation Age of Onset   Stroke Mother    Heart attack Father    Heart attack Maternal Grandmother    Heart failure Maternal Grandfather    Heart attack Paternal Grandmother    Heart attack Paternal Grandfather    Social History   Socioeconomic History   Marital status: Married    Spouse name: Not on file   Number of children: Not on file   Years of education: Not on file   Highest education level: Not on file  Occupational History   Not on file  Tobacco Use   Smoking status: Former    Packs/day: 1.50    Years: 30.00    Total pack years: 45.00    Types: Cigarettes    Quit date: 06/09/1999    Years since quitting: 23.3   Smokeless tobacco: Never  Vaping Use   Vaping Use: Never used  Substance and Sexual Activity   Alcohol use: Yes    Alcohol/week: 24.0 standard drinks of alcohol    Types: 24 Cans of beer per week    Comment: Occasional   Drug use: No   Sexual activity: Not on file  Other Topics Concern   Not on file  Social History Narrative   Not on file   Social Determinants of Health   Financial Resource Strain: Not on file  Food Insecurity: Not on file  Transportation Needs: Not on file  Physical Activity: Not on file  Stress: Not on file  Social Connections: Not on file   No Known Allergies  Medications   (Not in a hospital admission)     Current Facility-Administered Medications:    meclizine (ANTIVERT) tablet 25 mg, 25 mg, Oral, Once, Trifan, Carola Rhine, MD   ondansetron [facility]) 4 MG/2ML injection, , , ,   Current Outpatient Medications:    aspirin EC 81 MG tablet, Take 81 mg by mouth daily. (Patient not taking: Reported on 07/13/2021), Disp: , Rfl:    atorvastatin (LIPITOR) 40 MG tablet, Take 40 mg by mouth daily., Disp: , Rfl:    benzonatate (TESSALON) 100 MG capsule, Take 1 capsule (100 mg total) by mouth 3 (three) times daily as needed for  cough. (Patient not taking: Reported on 07/13/2021), Disp: 21 capsule, Rfl: 0   diltiazem (CARDIZEM CD) 180 MG 24 hr capsule, TAKE 1 CAPSULE BY MOUTH EVERY DAY, Disp: 90 capsule, Rfl: 3   doxycycline (VIBRAMYCIN) 100 MG capsule, Take 1 capsule (100 mg total) by mouth 2 (two) times daily., Disp: 20 capsule, Rfl: 0   glimepiride (AMARYL) 1 MG tablet, Take 1 mg by mouth daily. (Patient not taking: Reported on 07/13/2021), Disp: , Rfl: 1   glimepiride (AMARYL) 2 MG tablet, Take 2 mg by mouth every morning., Disp: , Rfl:    HYDROcodone-acetaminophen (NORCO) 10-325 MG tablet, Take 1 tablet by mouth 2 (two) times daily as needed., Disp: , Rfl: 0   JANUVIA 100 MG tablet, Take 50 mg by mouth daily. , Disp: , Rfl: 1   lisinopril-hydrochlorothiazide (ZESTORETIC) 20-12.5 MG tablet, Take 2 tablets by mouth daily., Disp: , Rfl:    metFORMIN (GLUCOPHAGE) 500 MG tablet, Take 1,000 mg by mouth daily. , Disp: , Rfl:    Multiple Vitamin (MULTIVITAMIN WITH MINERALS) TABS tablet, Take 1 tablet by mouth daily., Disp: , Rfl:    sildenafil (REVATIO) 20 MG tablet, Take 20-100 mg by mouth as directed. (Patient not taking: Reported on 07/13/2021), Disp: , Rfl:    TRELEGY ELLIPTA 100-62.5-25 MCG/INH AEPB, SMARTSIG:1 Inhalation Via Inhaler Daily, Disp: , Rfl:   Vitals   Vitals:   09/23/22 0817  BP: (!) 172/77  Pulse: 72  Resp: 18  SpO2: 100%     There is no height or weight on file to calculate BMI.  Physical Exam   Exam performed over telemedicine with 2-way video and audio communication and with assistance of bedside RN  Physical Exam Gen: A&O x4, NAD Resp: normal WOB CV: extremities appear well-perfused  Neuro: *MS: A&O x4. Follows multi-step commands.  *Speech: mildly dysarthric, no aphasia, able to name and repeat *CN: PERRL 44m, EOMI, VFF by confrontation, sensation intact, L NLF flattening, hearing intact to voice *Motor:   Normal bulk.  No tremor, rigidity or bradykinesia. No pronator drift. All  extremities appear full-strength and symmetric. *Sensory: SILT. Symmetric. No double-simultaneous extinction.  *Coordination:  Finger-to-nose, heel-to-shin, rapid alternating motions were intact. *Reflexes:  UTA 2/2 tele-exam *Gait: deferred  NIHSS = 2 for facial droop and dysarthria  Premorbid mRS = 0   Labs   CBC: No results for input(s): "WBC", "NEUTROABS", "HGB", "HCT", "MCV", "PLT" in the last 168 hours.  Basic Metabolic Panel:  Lab Results  Component Value Date   NA 134 (  L) 04/02/2021   K 4.0 04/02/2021   CO2 24 04/02/2021   GLUCOSE 185 (H) 04/02/2021   BUN 39 (H) 04/02/2021   CREATININE 2.01 (H) 04/02/2021   CALCIUM 9.3 04/02/2021   GFRNONAA 34 (L) 04/02/2021   GFRAA 45 07/14/2019   Lipid Panel:  Lab Results  Component Value Date   LDLCALC 109 (H) 11/19/2019   HgbA1c:  Lab Results  Component Value Date   HGBA1C 7.2 07/14/2019   Urine Drug Screen: No results found for: "LABOPIA", "COCAINSCRNUR", "LABBENZ", "AMPHETMU", "THCU", "LABBARB"  Alcohol Level No results found for: "ETH"   Impression   76 year old gentleman with a past medical history significant for COPD, CKD stage III, hypertension, hyperlipidemia, diabetes type 2 who presents with vertigo since 2 AM.  NIH stroke scale was a 2 for dysarthria and left facial droop.  CT head showed no acute intracranial process.  TNK not administered due to presentation outside the window.  CTA showed no LVO.  CTP showed no perfusion mismatch.  Ddx includes peripheral vertigo vs small cerebellar stroke. Given imaging findings and exam any cerebellar infarct would be small and not favored to cause malignant edema therefore he is appropriate for admission to [facility] for stroke workup.   Recommendations   - Admit for stroke workup - Permissive HTN x48 hrs from sx onset or until stroke ruled out by MRI goal BP <220/110. PRN labetalol or hydralazine if BP above these parameters. Avoid oral antihypertensives. - MRI brain wo  contrast - TTE - Check A1c and LDL + add statin per guidelines - ASA '81mg'$  daily + plavix '75mg'$  daily x21 days f/b ASA '81mg'$  daily monotherapy after that - q4 hr neuro checks - STAT head CT for any change in neuro exam - Tele - PT/OT/SLP - Stroke education - Amb referral to neurology upon discharge - If further neurology assistance is needed after today please place teleconsult to Dr. Lynnda Shields AM for f/u (under neurology in [facility])  ______________________________________________________________________   Thank you for the opportunity to take part in the care of this patient. If you have any further questions, please contact the neurology consultation attending.  Signed,  Su Monks, MD [facility] [phone]  If 7pm- 7am, please page neurology on call as listed in [place].  **Any copied and pasted documentation in this note was written by me in another application not billed for and pasted by me into this document.

## 2022-09-23 NOTE — Assessment & Plan Note (Signed)
-   Resume strict diabetic diet, resume home medication including Januvia, Amaryl -Metformin may be held for next 24 hours due to CT, CTA

## 2022-09-23 NOTE — Assessment & Plan Note (Addendum)
-   Symptoms of vertigo, left-sided weakness, -CT scan of the head not convincing yet, pending MRI of the head -Patient has been evaluated by on-call neurologist Dr. Quinn Axe -Will be admitted for stroke work-up, including MRI of the head in a.m., 2D echocardiogram, labs -Per neurology aspirin and high-dose statin initiated -Continue neurochecks -Continue telemetry monitoring -Patient is on Cardizem but no history of A-fib

## 2022-09-23 NOTE — ED Notes (Signed)
While beginning transfer to CT bed, pt became extremely nauseous. CT delayed to allow IV access and admin of antiemetic.

## 2022-09-23 NOTE — ED Provider Notes (Signed)
[facility] EMERGENCY DEPARTMENT Provider Note   CSN: [id number] Arrival date & time: 09/23/22  3086  An emergency department physician performed an initial assessment on this suspected stroke patient at 0830.  History  Chief Complaint  Patient presents with   Dizziness    Rick [name] is a 76 y.o. male with a history of hypertension, high cholesterol, diabetes, presenting to the ED with acute onset of vertigo, also reporting left leg weakness and headache.  Patient reports he went to bed around 10 PM yesterday evening feeling well.  He woke up around 2 AM this morning with severe vertigo, the room spinning around him.  He was able to lay down and the feeling diminished.  He woke up again later this morning to use the bathroom, and said when bending over in the bathroom he had a severe onset again of vertigo, this time with significant nausea.  He noticed he was having a headache at the time.  He says he feels "just off".  He denies any prior history of vertigo.  He denies history of TIA or stroke or smoking per his recollection.  He was able to walk into the hospital.  Subsequently during my neurological assessment around 0830, he reported that his left leg felt weak.  He does have a history of several spinal surgeries, but denies that he has chronic weakness in his left leg.  Typically can ambulate steadily.  HPI     Home Medications Prior to Admission medications   Medication Sig Start Date End Date Taking? Authorizing Provider  atorvastatin (LIPITOR) 40 MG tablet Take 40 mg by mouth daily.   Yes [provider]  diltiazem (CARDIZEM CD) 240 MG 24 hr capsule Take 240 mg by mouth daily. 08/22/22  Yes [provider]  glimepiride (AMARYL) 2 MG tablet Take 2 mg by mouth every morning. 06/19/21  Yes [provider]  JANUVIA 100 MG tablet Take 50 mg by mouth daily.  08/16/16  Yes [provider]  lisinopril-hydrochlorothiazide (ZESTORETIC) 20-12.5 MG  tablet Take 2 tablets by mouth daily. 10/24/19  Yes [provider]  metFORMIN (GLUCOPHAGE) 500 MG tablet Take 1,000 mg by mouth daily.    Yes [provider]  Multiple Vitamin (MULTIVITAMIN WITH MINERALS) TABS tablet Take 1 tablet by mouth daily.   Yes [provider]  TRELEGY ELLIPTA 100-62.5-25 MCG/INH AEPB Inhale 1 puff into the lungs daily. 10/24/19  Yes [provider]      Allergies    Patient has no known allergies.    Review of Systems   Review of Systems  Physical Exam Updated Vital Signs BP (!) 178/82   Pulse 70   Temp 97.8 F (36.6 C) (Oral)   Resp 12   SpO2 92%  Physical Exam Constitutional:      General: He is not in acute distress. HENT:     Head: Normocephalic and atraumatic.  Eyes:     Conjunctiva/sclera: Conjunctivae normal.     Pupils: Pupils are equal, round, and reactive to light.  Cardiovascular:     Rate and Rhythm: Normal rate and regular rhythm.  Pulmonary:     Effort: Pulmonary effort is normal. No respiratory distress.  Abdominal:     General: There is no distension.     Tenderness: There is no abdominal tenderness.  Skin:    General: Skin is warm and dry.  Neurological:     Mental Status: He is alert and oriented to person, place, and time.  Mental status is at baseline.     Comments: Unilateral left beating nystagmus with rightward eye gaze, test of skew was negative.  Head impulse test is equivocal Patient does have 4 out of 5 strength with hip flexion of the left lower extremity.  Remaining strength exam is normal. He has no pronator drift.  He is able to perform finger-nose testing accurately   Psychiatric:        Mood and Affect: Mood normal.        Behavior: Behavior normal.     ED Results / Procedures / Treatments   Labs (all labs ordered are listed, but only abnormal results are displayed) Labs Reviewed  CBC - Abnormal; Notable for the following components:      Result Value   RBC 4.04 (*)     Hemoglobin 11.9 (*)    HCT 36.8 (*)    All other components within normal limits  COMPREHENSIVE METABOLIC PANEL - Abnormal; Notable for the following components:   Glucose, Bld 188 (*)    BUN 29 (*)    Creatinine, Ser 2.23 (*)    ALT 49 (*)    GFR, Estimated 30 (*)    All other components within normal limits  CBG MONITORING, ED - Abnormal; Notable for the following components:   Glucose-Capillary 161 (*)    All other components within normal limits  I-STAT CHEM 8, ED - Abnormal; Notable for the following components:   BUN 27 (*)    Creatinine, Ser 2.40 (*)    Glucose, Bld 186 (*)    Hemoglobin 12.9 (*)    HCT 38.0 (*)    All other components within normal limits  ETHANOL  PROTIME-INR  APTT  DIFFERENTIAL  RAPID URINE DRUG SCREEN, HOSP PERFORMED  URINALYSIS, ROUTINE W REFLEX MICROSCOPIC  MAGNESIUM  PHOSPHORUS  LIPID PANEL  TSH  HEMOGLOBIN A1C  VITAMIN B12    EKG EKG Interpretation  Date/Time:  Sunday September 23 2022 08:19:50 EDT Ventricular Rate:  67 PR Interval:  192 QRS Duration: 87 QT Interval:  402 QTC Calculation: 425 R Axis:   35 Text Interpretation: Sinus rhythm RSR' in V1 or V2, right VCD or RVH Confirmed by Octaviano Glow [phone]) on 09/23/2022 9:01:38 AM  Radiology CT ANGIO HEAD NECK W WO CM W PERF (CODE STROKE)  Result Date: 09/23/2022 CLINICAL DATA:  75 year old male code stroke presentation. Left leg weakness and questionable hyperdenseright MCA on plain head CT. EXAM: CT ANGIOGRAPHY HEAD AND NECK CT PERFUSION BRAIN TECHNIQUE: Multidetector CT imaging of the head and neck was performed using the standard protocol during bolus administration of intravenous contrast. Multiplanar CT image reconstructions and MIPs were obtained to evaluate the vascular anatomy. Carotid stenosis measurements (when applicable) are obtained utilizing NASCET criteria, using the distal internal carotid diameter as the denominator. Multiphase CT imaging of the brain was  performed following IV bolus contrast injection. Subsequent parametric perfusion maps were calculated using RAPID software. RADIATION DOSE REDUCTION: This exam was performed according to the departmental dose-optimization program which includes automated exposure control, adjustment of the mA and/or kV according to patient size and/or use of iterative reconstruction technique. CONTRAST:  152m OMNIPAQUE IOHEXOL 350 MG/ML SOLN COMPARISON:  Plain head CT 0857 hours today. FINDINGS: CT Brain Perfusion Findings: ASPECTS: Ten CBF (<30%) Volume: 055mPerfusion (Tmax>6.0s) volume: 44m744mismatch Volume: Not applicable Infarction Location:Not applicable CTA NECK Skeleton: Widespread cervical spine degeneration. Chronic appearing mild T3 compression fracture. No acute osseous abnormality identified. Upper chest: Mild atelectasis. Negative  visible superior mediastinum. Other neck: Glottis is closed. No acute soft tissue finding identified in the neck. Aortic arch: Calcified aortic atherosclerosis. 3 vessel arch configuration. Right carotid system: Tortuous brachiocephalic artery without stenosis. Calcified plaque at the right CCA origin but no stenosis. Additional calcified plaque approaching the right carotid bifurcation and moderate calcified plaque at the right ICA medial origin and posterior bulb. Up to 50 % stenosis with respect to the distal vessel. The right ICA remains patent to the skull base. Left carotid system: Mild tortuosity. Soft and calcified plaque in the anterior left CCA before the bifurcation without stenosis. Mild calcified plaque mostly at the left ICA bulb without stenosis. Vertebral arteries: Mild calcified plaque at the right subclavian artery origin without stenosis. Normal right vertebral artery origin. Late entry of the right vertebral artery into the cervical transverse foramen, but otherwise patent and normal to the skull base. Mild tortuosity and calcified plaque in the proximal left subclavian  artery without stenosis. Normal left vertebral artery origin. Codominant left vertebral artery appears patent and normal to the skull base. CTA HEAD Posterior circulation: Normal distal vertebral arteries and vertebrobasilar junction. Diminutive or absent PICA. Patent basilar artery to the tip without stenosis. Patent SCA and PCA origins. Posterior communicating arteries are diminutive or absent. Bilateral PCA branches are within normal limits. Anterior circulation: Both ICA siphons are patent. Mild siphon ectasia on the left. Mild left and mild to moderate right ICA siphon calcified plaque but no hemodynamically significant siphon stenosis. Patent carotid termini, MCA and ACA origins. Diminutive or absent anterior communicating artery. Bilateral ACA branches are within normal limits. Left MCA M1 segment bifurcates early without stenosis. Right MCA M1 segment and right MCA trifurcation are patent without stenosis. Bilateral MCA branches are within normal limits. Venous sinuses: Patent. Anatomic variants: None significant. Review of the MIP images confirms the above findings Preliminary report discussed by telephone with Neurology Dr. Quinn Axe on 09/23/2022 at 0914 hours. IMPRESSION: 1. Negative for large vessel occlusion.  Negative CT Perfusion. 2. Bilateral cervical carotid and ICA siphon mostly calcified atherosclerosis. But no hemodynamically significant stenosis. 3. Vertebral arteries and posterior circulation appear negative. 4. Chronic appearing mild T3 compression fracture. 5. Aortic Atherosclerosis (ICD10-I70.0). Electronically Signed   By: Genevie Ann M.D.   On: 09/23/2022 09:21   CT HEAD CODE STROKE WO CONTRAST  Result Date: 09/23/2022 CLINICAL DATA:  Code stroke. 76 year old male. Headache, vertigo, left lower extremity weakness. EXAM: CT HEAD WITHOUT CONTRAST TECHNIQUE: Contiguous axial images were obtained from the base of the skull through the vertex without intravenous contrast. RADIATION DOSE  REDUCTION: This exam was performed according to the departmental dose-optimization program which includes automated exposure control, adjustment of the mA and/or kV according to patient size and/or use of iterative reconstruction technique. COMPARISON:  None Available. FINDINGS: Brain: Cerebral volume is within normal limits for age. No midline shift, ventriculomegaly, mass effect, evidence of mass lesion, intracranial hemorrhage or evidence of cortically based acute infarction. Gray-white matter differentiation is within normal limits for age. Vascular: Calcified atherosclerosis at the skull base. However a, there is a questionable hyperdense right MCA on sagittal images 21 and 22, but also streak artifact here. Skull: Negative. Sinuses/Orbits: Visualized paranasal sinuses and mastoids are clear. Tympanic cavities appear clear. Other: Visualized orbits and scalp soft tissues are within normal limits. ASPECTS [facility] Stroke Program Early CT Score) Total score (0-10 with 10 being normal): 10 IMPRESSION: Questionable hyperdense Right MCA. But otherwise normal for age noncontrast CT appearance of the  brain. ASPECTS is 10. Study discussed by telephone with Dr. Octaviano Glow on 09/23/2022 at 09:01. CTA is pending. Electronically Signed   By: Genevie Ann M.D.   On: 09/23/2022 09:03    Procedures .Critical Care  Performed by: Wyvonnia Dusky, MD Authorized by: Wyvonnia Dusky, MD   Critical care provider statement:    Critical care time (minutes):  30   Critical care time was exclusive of:  Separately billable procedures and treating other patients   Critical care was necessary to treat or prevent imminent or life-threatening deterioration of the following conditions:  CNS failure or compromise   Critical care was time spent personally by me on the following activities:  Ordering and performing treatments and interventions, ordering and review of laboratory studies, ordering and review of radiographic studies,  pulse oximetry, review of old charts, examination of patient and evaluation of patient's response to treatment   Care discussed with: admitting provider   Comments:     Code stroke evaluation, including discussion with neurologist, and repeat neurological assessment of the patient. TNK was considered, but in conjunction with specialist conversation, there was no sufficient indication to administer at this time.     Medications Ordered in ED Medications  ondansetron (ZOFRAN) 4 MG/2ML injection (  Canceled Entry 09/23/22 0852)   stroke: early stages of recovery book (has no administration in time range)  aspirin tablet 325 mg (has no administration in time range)    Followed by  aspirin chewable tablet 81 mg (has no administration in time range)  heparin injection 5,000 Units (has no administration in time range)  sodium chloride flush (NS) 0.9 % injection 3 mL (has no administration in time range)  0.9 %  sodium chloride infusion (has no administration in time range)  sodium chloride flush (NS) 0.9 % injection 3 mL (has no administration in time range)  sodium chloride flush (NS) 0.9 % injection 3 mL (has no administration in time range)  0.9 %  sodium chloride infusion (has no administration in time range)  acetaminophen (TYLENOL) tablet 650 mg (has no administration in time range)    Or  acetaminophen (TYLENOL) suppository 650 mg (has no administration in time range)  oxyCODONE (Oxy IR/ROXICODONE) immediate release tablet 5 mg (has no administration in time range)  HYDROmorphone (DILAUDID) injection 0.5-1 mg (has no administration in time range)  traZODone (DESYREL) tablet 25 mg (has no administration in time range)  senna-docusate (Senokot-S) tablet 1 tablet (has no administration in time range)  bisacodyl (DULCOLAX) EC tablet 5 mg (has no administration in time range)  sodium phosphate (FLEET) 7-19 GM/118ML enema 1 enema (has no administration in time range)  ondansetron (ZOFRAN)  tablet 4 mg (has no administration in time range)    Or  ondansetron (ZOFRAN) injection 4 mg (has no administration in time range)  ipratropium (ATROVENT) nebulizer solution 0.5 mg (has no administration in time range)  levalbuterol (XOPENEX) nebulizer solution 0.63 mg (has no administration in time range)  hydrALAZINE (APRESOLINE) injection 10 mg (has no administration in time range)  atorvastatin (LIPITOR) tablet 80 mg (has no administration in time range)  diltiazem (CARDIZEM CD) 24 hr capsule 180 mg (has no administration in time range)  glimepiride (AMARYL) tablet 2 mg (has no administration in time range)  multivitamin with minerals tablet 1 tablet (has no administration in time range)  fluticasone furoate-vilanterol (BREO ELLIPTA) 100-25 MCG/ACT 1 puff (1 puff Inhalation Not Given 09/23/22 1033)    And  umeclidinium bromide (  INCRUSE ELLIPTA) 62.5 MCG/ACT 1 puff (1 puff Inhalation Not Given 09/23/22 1034)  insulin aspart (novoLOG) injection 0-6 Units (has no administration in time range)  meclizine (ANTIVERT) tablet 25 mg (25 mg Oral Given 09/23/22 0939)  iohexol (OMNIPAQUE) 350 MG/ML injection 75 mL (100 mLs Intravenous Contrast Given 09/23/22 0838)  ondansetron (ZOFRAN) injection 4 mg (4 mg Intravenous Given 09/23/22 0842)  ondansetron (ZOFRAN) injection 4 mg (4 mg Intravenous Given 09/23/22 0842)  sodium chloride 0.9 % bolus 1,000 mL (1,000 mLs Intravenous New Bag/Given 09/23/22 7893)    ED Course/ Medical Decision Making/ A&P Clinical Course as of 09/23/22 1046  Sun Sep 23, 2022  0834 [facility] was 10 pm last night for vertigo symptoms.  However given the evolution of the patient's symptoms now with reported left leg weakness in the ED, code stroke has been activated [MT]  0920 I spoke to Dr Quinn Axe from neurology who reviewed the patient's imaging, no large vessel occlusion or concerns for significant vascular occlusion.  MCA lesion may be streak artifact per both radiology and  neurologist evaluation.  There remains a possibility of a small cerebellar lesion, but at this time the patient is felt to be reasonably stable for admission to the hospital here at [facility], and can have an MRI performed tomorrow as it is not available today.  In the meantime, the neurologist will order aspirin and Plavix and any other additional stroke treatment medications she deems appropriate [MT]  [phone] Patient reassessed.  He reports that he is feeling somewhat better but continues to have significant vertigo with any movement of his head, and still feels "not right".  We discussed medical admission and he is in agreement. [MT]  0950 Admitted to hospitalist [MT]    Clinical Course User Index [MT] Brezlyn Manrique, Carola Rhine, MD                           Medical Decision Making Amount and/or Complexity of Data Reviewed Labs: ordered. Radiology: ordered.  Risk Prescription drug management. Decision regarding hospitalization.   This patient presents to the ED with concern for headache, vertigo, leg weakness. This involves an extensive number of treatment options, and is a complaint that carries with it a high risk of complications and morbidity.  The differential diagnosis includes CVA versus peripheral vertigo versus other  Co-morbidities that complicate the patient evaluation: Several stroke risk factors including obesity, high cholesterol, hypertension, diabetes  Additional history obtained from patient's wife at bedside   I ordered and personally interpreted labs.  The pertinent results include: Chronic kidney disease, no other acute abnormalities noted  I ordered imaging studies including CT head, CTA head and neck I independently visualized and interpreted imaging which showed question of MCA lesion versus artifact on CT head, CTA with no large vessel occlusion I agree with the radiologist interpretation  Contrast administration for CTA was indicated by emergent necessity with concern  for posterior circulation occlusion based on the patient's symptoms.  He is noted to have chronic kidney disease with a GFR of 30 here.  We will hydrate with a liter of fluid, and his kidney function will be monitored in the hospital  The patient was maintained on a cardiac monitor.  I personally viewed and interpreted the cardiac monitored which showed an underlying rhythm of: Sinus rhythm  Per my interpretation the patient's ECG shows normal sinus rhythm with no acute ischemic findings  I ordered medication including Zofran, meclizine for  vertigo and nausea.  IV fluids for renal hydration, in the setting of necessary emergent contrast administration  I have reviewed the patients home medicines and have made adjustments as needed  I requested consultation with the neurology,  and discussed lab and imaging findings as well as pertinent plan - they recommend: See ED course  After the interventions noted above, I reevaluated the patient and found that they have: stayed the same   Dispostion:  After consideration of the diagnostic results and the patients response to treatment, I feel that the patent would benefit from medical admission.         Final Clinical Impression(s) / ED Diagnoses Final diagnoses:  Vertigo  Left leg weakness    Rx / DC Orders ED Discharge Orders     None         Wyvonnia Dusky, MD 09/23/22 1047

## 2022-09-23 NOTE — Assessment & Plan Note (Signed)
-   Hemodynamically stable, but holding BP medications of lisinopril/HCTZ -Allowing for permissive hypertension

## 2022-09-23 NOTE — Hospital Course (Signed)
Rick [name] is a 76 year old male with a history of atherosclerosis of aorta, DM 2, HTN, HLD, CKD stage III, benign kidney tumor, lateral ventral hernia low back pain, ED, presented with symptoms of vertigo started about 2 AM this morning, and left leg weakness and headache in the ED this AM.  He describes the vertigo as room spinning around him, vertigo gets worse with movement causes significant nausea but no vomiting, headaches feeling"off" since the symptoms started denies any history of vertigo or strokes in the past In ED he developed more headache and left leg weakness around 8:30 AM   Admits that he has a history of several spinal surgeries denies any chronic asymmetric weaknesses.  Denies of having any visual changes.  ED:  Vitals:   09/23/22 0817 09/23/22 0930  BP: (!) 172/77 (!) 149/70  Pulse: 72 63  Resp: 18 15  SpO2: 100% 97%   CBC hemoglobin 11.9, BUN 29, creatinine 2.23, ALT 49, GFR 30, BUN 27, EKG normal sinus rhythm rate of 67. CT of the head/CT of head and neck reviewed questionable MCA lesion versus artifact on CT of the head, CTA report negative for any occlusions or abnormalities  Code stroke was called, teleneurology Dr. Quinn Axe has evaluated the patient Recommending patient to be admitted to [facility], MRI in a.m., can continue with stroke work-up.Marland KitchenMarland KitchenMarland Kitchen

## 2022-09-23 NOTE — Plan of Care (Signed)

## 2022-09-24 ENCOUNTER — Observation Stay (HOSPITAL_COMMUNITY): Payer: Medicare Other

## 2022-09-24 ENCOUNTER — Other Ambulatory Visit: Payer: Self-pay | Admitting: Family Medicine

## 2022-09-24 DIAGNOSIS — J449 Chronic obstructive pulmonary disease, unspecified: Secondary | ICD-10-CM | POA: Diagnosis not present

## 2022-09-24 DIAGNOSIS — E1122 Type 2 diabetes mellitus with diabetic chronic kidney disease: Secondary | ICD-10-CM | POA: Diagnosis not present

## 2022-09-24 DIAGNOSIS — N184 Chronic kidney disease, stage 4 (severe): Secondary | ICD-10-CM | POA: Diagnosis not present

## 2022-09-24 DIAGNOSIS — R42 Dizziness and giddiness: Secondary | ICD-10-CM | POA: Diagnosis not present

## 2022-09-24 LAB — PROTIME-INR
INR: 1 (ref 0.8–1.2)
Prothrombin Time: 13.4 seconds (ref 11.4–15.2)

## 2022-09-24 LAB — APTT: aPTT: 27 seconds (ref 24–36)

## 2022-09-24 LAB — BASIC METABOLIC PANEL
Anion gap: 6 (ref 5–15)
BUN: 24 mg/dL — ABNORMAL HIGH (ref 8–23)
CO2: 28 mmol/L (ref 22–32)
Calcium: 8.9 mg/dL (ref 8.9–10.3)
Chloride: 106 mmol/L (ref 98–111)
Creatinine, Ser: 2.03 mg/dL — ABNORMAL HIGH (ref 0.61–1.24)
GFR, Estimated: 34 mL/min — ABNORMAL LOW (ref >60)
Glucose, Bld: 113 mg/dL — ABNORMAL HIGH (ref 70–99)
Potassium: 4.5 mmol/L (ref 3.5–5.1)
Sodium: 140 mmol/L (ref 135–145)

## 2022-09-24 LAB — CBC
HCT: 34.4 % — ABNORMAL LOW (ref 39.0–52.0)
Hemoglobin: 10.9 g/dL — ABNORMAL LOW (ref 13.0–17.0)
MCH: 29.7 pg (ref 26.0–34.0)
MCHC: 31.7 g/dL (ref 30.0–36.0)
MCV: 93.7 fL (ref 80.0–100.0)
Platelets: 218 10*3/uL (ref 150–400)
RBC: 3.67 MIL/uL — ABNORMAL LOW (ref 4.22–5.81)
RDW: 14.2 % (ref 11.5–15.5)
WBC: 6.7 10*3/uL (ref 4.0–10.5)
nRBC: 0 % (ref 0.0–0.2)

## 2022-09-24 LAB — GLUCOSE, CAPILLARY: Glucose-Capillary: 148 mg/dL — ABNORMAL HIGH (ref 70–99)

## 2022-09-24 MED ORDER — ONDANSETRON HCL 4 MG PO TABS: 4.0000 mg | ORAL_TABLET | Freq: Four times a day (QID) | ORAL | 0 refills | Status: DC | PRN

## 2022-09-24 MED ORDER — MECLIZINE HCL 25 MG PO TABS: 25.0000 mg | ORAL_TABLET | Freq: Three times a day (TID) | ORAL | 0 refills | Status: DC | PRN

## 2022-09-24 NOTE — Evaluation (Signed)
Occupational Therapy Evaluation Patient Details Name: Rick [name] MRN: [id number] DOB: December 20, 1945 Today's Date: 09/24/2022   History of Present Illness 76 y.o. M admitted on 09/23/22 due to symptoms of Vertigo. Pt was admitted as a stroke work up, however CT's are negative. PMH significant for atherosclerosis of aorta, DM 2, HTN, HLD, CKD stage III, benign kidney tumor, lateral ventral hernia low back pain.   Clinical Impression   Pt admitted for concerns listed above. PTA pt reported that he is independent with all ADL's and IADL's, including driving. At this time, pt presents with increased dizziness during BPPV testing, which he reports makes him feel "weird" once dizziness has subsided. During dizzy spell pt needs min guard for safety, however as it clears up he is back near his baseline. Recommending family support as needed and no follow up OT at this time. Acute OT will sign off.       Recommendations for follow up therapy are one component of a multi-disciplinary discharge planning process, led by the attending physician.  Recommendations may be updated based on patient status, additional functional criteria and insurance authorization.   Follow Up Recommendations  No OT follow up    Assistance Recommended at Discharge Set up Supervision/Assistance  Patient can return home with the following A little help with bathing/dressing/bathroom;Assistance with cooking/housework    Functional Status Assessment  Patient has had a recent decline in their functional status and demonstrates the ability to make significant improvements in function in a reasonable and predictable amount of time.  Equipment Recommendations  None recommended by OT    Recommendations for Other Services       Precautions / Restrictions Precautions Precautions: None Restrictions Weight Bearing Restrictions: No      Mobility Bed Mobility Overal bed mobility: Modified Independent                   Transfers Overall transfer level: Modified independent Equipment used: None                      Balance Overall balance assessment: Mild deficits observed, not formally tested                                         ADL either performed or assessed with clinical judgement   ADL Overall ADL's : At baseline;Modified independent                                             Vision Baseline Vision/History: 0 No visual deficits Ability to See in Adequate Light: 0 Adequate Patient Visual Report: Nausea/blurring vision with head movement Vision Assessment?: Vision impaired- to be further tested in functional context Additional Comments: PT completed BPPV assessment, pt having verticle nystagmus upon sitting up from flat surfaces.     Perception     Praxis      Pertinent Vitals/Pain Pain Assessment Pain Assessment: No/denies pain     Hand Dominance Right   Extremity/Trunk Assessment Upper Extremity Assessment Upper Extremity Assessment: Overall WFL for tasks assessed   Lower Extremity Assessment Lower Extremity Assessment: Defer to PT evaluation   Cervical / Trunk Assessment Cervical / Trunk Assessment: Normal   Communication Communication Communication: No difficulties   Cognition Arousal/Alertness: Awake/alert Behavior During Therapy:  WFL for tasks assessed/performed Overall Cognitive Status: Within Functional Limits for tasks assessed                                       General Comments  VSS on RA, daughter present and supportive.    Exercises     Shoulder Instructions      Home Living Family/patient expects to be discharged to:: Private residence Living Arrangements: Spouse/significant other Available Help at Discharge: Family;Available 24 hours/day Type of Home: House Home Access: Stairs to enter [employer] of Steps: 1 step Entrance Stairs-Rails: None Home Layout: One level      Bathroom Shower/Tub: Occupational psychologist: Handicapped height     Home Equipment: Cane - quad;Cane - single point;Crutches;BSC/3in1          Prior Functioning/Environment Prior Level of Function : Independent/Modified Independent;Driving             Mobility Comments: No AD ADLs Comments: Reports indep        OT Problem List: Decreased strength;Decreased activity tolerance;Impaired balance (sitting and/or standing);Impaired sensation      OT Treatment/Interventions:      OT Goals(Current goals can be found in the care plan section) Acute Rehab OT Goals Patient Stated Goal: To decrease dizziness OT Goal Formulation: With patient Time For Goal Achievement: 09/24/22 Potential to Achieve Goals: Good  OT Frequency:      Co-evaluation              AM-PAC OT "6 Clicks" Daily Activity     Outcome Measure Help from another person eating meals?: None Help from another person taking care of personal grooming?: None Help from another person toileting, which includes using toliet, bedpan, or urinal?: A Little Help from another person bathing (including washing, rinsing, drying)?: A Little Help from another person to put on and taking off regular upper body clothing?: None Help from another person to put on and taking off regular lower body clothing?: A Little 6 Click Score: 21   End of Session Equipment Utilized During Treatment: Gait belt Nurse Communication: Mobility status  Activity Tolerance: Patient tolerated treatment well Patient left: in bed;with call bell/phone within reach;with family/visitor present  OT Visit Diagnosis: Unsteadiness on feet (R26.81);Other abnormalities of gait and mobility (R26.89);Muscle weakness (generalized) (M62.81)                Time: 4235-3614 OT Time Calculation (min): 23 min Charges:  OT General Charges $OT Visit: 1 Visit OT Evaluation $OT Eval Low Complexity: 1 Low OT Treatments $Therapeutic Activity: 8-22  mins  Paulita Fujita, OTR/[facility] [place] 09/24/2022, 11:06 AM

## 2022-09-24 NOTE — Discharge Summary (Signed)
Physician Discharge Summary   Patient: Rick [name] MRN: [id number] DOB: 09-Aug-1946  Admit date:     09/23/2022  Discharge date: 09/24/22  Discharge Physician: Deatra James   PCP: Asencion Noble, MD   Recommendations at discharge:    Follow-up with a neurologist within 1 week for further evaluation of your dizziness Follow-up with PCP in 1-2 weeks -Hold you metformin for next 24 hours -We have discontinued your BP medication of lisinopril/HCTZ due to elevated creatinine (worsening kidney function) Continue discussion with primary care doctor regarding adjusting meds for your BP and monitoring kidney function  Discharge Diagnoses: Principal Problem:   Stroke [facility]) Active Problems:   Vertigo   DMII (diabetes mellitus, type 2) ([place])   Essential (primary) hypertension   Pure hypercholesterolemia, unspecified   Chronic kidney disease, stage 4 (severe) ([place])   Chronic obstructive pulmonary disease, unspecified ([place])   Type 2 diabetes mellitus with diabetic nephropathy ([place])  Resolved Problems:   * No resolved hospital problems. *  Hospital Course: Rick [name] is a 76 year old male with a history of atherosclerosis of aorta, DM 2, HTN, HLD, CKD stage III, benign kidney tumor, lateral ventral hernia low back pain, ED, presented with symptoms of vertigo started about 2 AM this morning, and left leg weakness and headache in the ED this AM.  He describes the vertigo as room spinning around him, vertigo gets worse with movement causes significant nausea but no vomiting, headaches feeling"off" since the symptoms started denies any history of vertigo or strokes in the past In ED he developed more headache and left leg weakness around 8:30 AM   Admits that he has a history of several spinal surgeries denies any chronic asymmetric weaknesses.  Denies of having any visual changes.  ED:  Vitals:   09/23/22 0817 09/23/22 0930  BP: (!) 172/77 (!) 149/70  Pulse: 72 63  Resp: 18 15   SpO2: 100% 97%   CBC hemoglobin 11.9, BUN 29, creatinine 2.23, ALT 49, GFR 30, BUN 27, EKG normal sinus rhythm rate of 67. CT of the head/CT of head and neck reviewed questionable MCA lesion versus artifact on CT of the head, CTA report negative for any occlusions or abnormalities  Code stroke was called, teleneurology Dr. Quinn Axe has evaluated the patient Recommending patient to be admitted to [facility], MRI in a.m., can continue with stroke work-up....      Assessment and Plan: * Stroke [facility]) - Acute stroke was ruled out  -MRI of the brain reviewed, negative for any acute abnormalities   -CT scan of the head not convincing yet, pending MRI of the head -Patient has been evaluated by on-call neurologist Dr. Quinn Axe  -Per neurology aspirin -Continue current statin  -Continue neurochecks-no new focal neurological findings -No arrhythmia on telemetry monitoring  -Evaluate for BPV, status post Hallpike maneuver, with no significant findings,  Vertigo - Acute stroke was ruled out -Possible BPV, PT has evaluate patient status post Hallpike maneuver-with minimal finding, but patient did find relief -Continue as needed meclizine -Home medication has been modified including discontinuation of lisinopril/HCTZ  -CT, CTA of head and neck, MRI of the brain was reviewed, with no acute findings  -No signs of upper respiratory infection including sinusitis or ear infections.  DMII (diabetes mellitus, type 2) ([place]) - Resume strict diabetic diet, resume home medication including Januvia, Amaryl -Metformin may be held for next 24 hours due to CT, CTA   Chronic obstructive pulmonary disease, unspecified ([place]) - Not O2 dependent at  baseline, on trilogy Ellipta 100- 62.5-25 mg, not on formulary, will substitute -As needed DuoNeb bronchodilators  Chronic kidney disease, stage 4 (severe) ([facility]) Lab Results  Component Value Date   CREATININE 2.03 (H) 09/24/2022   CREATININE 2.40 (H) 09/23/2022    CREATININE 2.23 (H) 09/23/2022  Avoid nephrotoxins -We have discontinued HCTZ lisinopril at this point   Pure hypercholesterolemia, unspecified - On Lipitor 40 mg,  Lipid Panel     Component Value Date/Time   CHOL 149 09/23/2022 0830   TRIG 53 09/23/2022 0830   HDL 50 09/23/2022 0830   CHOLHDL 3.0 09/23/2022 0830   VLDL 11 09/23/2022 0830   LDLCALC 88 09/23/2022 0830     Essential (primary) hypertension - Hemodynamically stable,  -Continue home medication of diltiazem at 240 mg daily, Discontinue lisinopril/HCTZ due to dizziness, elevated creatinine, A on CKD     Consultants: Teleneurology Procedures performed: Stroke work-up including CT, CTA of the head and neck, MRI of the brain, echo Hallpike maneuver for BPV Disposition: Home Diet recommendation:  Discharge Diet Orders (From admission, onward)     Start     Ordered   09/24/22 0000  Diet - low sodium heart healthy        09/24/22 1128           Cardiac and Carb modified diet DISCHARGE MEDICATION: Allergies as of 09/24/2022   No Known Allergies      Medication List     STOP taking these medications    lisinopril-hydrochlorothiazide 20-12.5 MG tablet Commonly known as: ZESTORETIC       TAKE these medications    atorvastatin 40 MG tablet Commonly known as: LIPITOR Take 40 mg by mouth daily.   diltiazem 240 MG 24 hr capsule Commonly known as: CARDIZEM CD Take 240 mg by mouth daily.   glimepiride 2 MG tablet Commonly known as: AMARYL Take 2 mg by mouth every morning.   Januvia 100 MG tablet Generic drug: sitaGLIPtin Take 50 mg by mouth daily.   meclizine 25 MG tablet Commonly known as: ANTIVERT Take 1 tablet (25 mg total) by mouth 3 (three) times daily as needed for dizziness.   metFORMIN 500 MG tablet Commonly known as: GLUCOPHAGE Take 1,000 mg by mouth daily.   multivitamin with minerals Tabs tablet Take 1 tablet by mouth daily.   ondansetron 4 MG tablet Commonly known as:  ZOFRAN Take 1 tablet (4 mg total) by mouth every 6 (six) hours as needed for nausea.   Trelegy Ellipta 100-62.5-25 MCG/ACT Aepb Generic drug: Fluticasone-Umeclidin-Vilant Inhale 1 puff into the lungs daily.        Discharge Exam: Filed Weights   09/23/22 1106 09/24/22 0500  Weight: 114 kg 115.1 kg      Physical Exam:   General:  AAO x 3,  cooperative, no distress;   HEENT:  Normocephalic, PERRL, otherwise with in Normal limits   Neuro:  CNII-XII intact. , normal motor and sensation, reflexes intact   Lungs:   Clear to auscultation BL, Respirations unlabored,  No wheezes / crackles  Cardio:    S1/S2, RRR, No murmure, No Rubs or Gallops   Abdomen:  Soft, non-tender, bowel sounds active all four quadrants, no guarding or peritoneal signs.  Muscular  skeletal:  Limited exam -global generalized weaknesses - in bed, able to move all 4 extremities,   2+ pulses,  symmetric, No pitting edema  Skin:  Dry, warm to touch, negative for any Rashes,  Wounds: Please see nursing documentation  Condition at discharge: good  The results of significant diagnostics from this hospitalization (including imaging, microbiology, ancillary and laboratory) are listed below for reference.   Imaging Studies: MR BRAIN WO CONTRAST  Result Date: 09/24/2022 CLINICAL DATA:  Neuro deficit, acute, stroke suspected.  Vertigo. EXAM: MRI HEAD WITHOUT CONTRAST TECHNIQUE: Multiplanar, multiecho pulse sequences of the brain and surrounding structures were obtained without intravenous contrast. COMPARISON:  CT studies yesterday FINDINGS: Brain: Mild age related volume loss. Diffusion imaging does not show any acute or subacute infarction. The brainstem and cerebellum are normal. Cerebral hemispheres show a few punctate foci of T2 and FLAIR signal in the white matter, often present at this age. No evidence of widespread small-vessel disease. No cortical or large vessel territory infarction. No mass  lesion, hemorrhage, hydrocephalus or extra-axial collection. Vascular: Major vessels at the base of the brain show flow. Skull and upper cervical spine: Negative Sinuses/Orbits: Clear/normal Other: None IMPRESSION: No acute or reversible finding. Mild age related volume loss. Few punctate foci of T2 and FLAIR signal in the cerebral hemispheric white matter, often seen at this age. Electronically Signed   By: Nelson Chimes M.D.   On: 09/24/2022 07:58   ECHOCARDIOGRAM COMPLETE  Result Date: 09/23/2022    ECHOCARDIOGRAM REPORT   Patient Name:   Rick [name] Date of Exam: 09/23/2022 Medical Rec #:  [id number]       Height:       71.0 in Accession #:    [id number]      Weight:       255.0 lb Date of Birth:  08/23/1946        BSA:          2.338 m Patient Age:    59 years        BP:           149/70 mmHg Patient Gender: M               HR:           63 bpm. Exam Location:  [facility] Procedure: 2D Echo, Cardiac Doppler and Color Doppler Indications:    Stroke  History:        Patient has prior history of Echocardiogram examinations, most                 recent 10/18/2016. Stroke and COPD; Risk Factors:Hypertension,                 Diabetes and Former Smoker.  Sonographer:    Wenda Low Referring Phys: [id number] Derek Jack  Sonographer Comments: Patient is obese. IMPRESSIONS  1. Left ventricular ejection fraction, by estimation, is 65 to 70%. The left ventricle has normal function. The left ventricle has no regional wall motion abnormalities. There is mild concentric left ventricular hypertrophy. Left ventricular diastolic parameters are consistent with Grade I diastolic dysfunction (impaired relaxation).  2. Right ventricular systolic function is normal. The right ventricular size is normal. There is normal pulmonary artery systolic pressure.  3. The mitral valve is degenerative. No evidence of mitral valve regurgitation. Mild mitral stenosis. The mean mitral valve gradient is 3.0 mmHg. Severe mitral annular  calcification.  4. The aortic valve was not well visualized. Aortic valve regurgitation is not visualized. Aortic valve sclerosis is present, with no evidence of aortic valve stenosis. Comparison(s): Unable to see prior study (2017). FINDINGS  Left Ventricle: Left ventricular ejection fraction, by estimation, is 65 to 70%. The left ventricle has normal function. The  left ventricle has no regional wall motion abnormalities. The left ventricular internal cavity size was normal in size. There is  mild concentric left ventricular hypertrophy. Left ventricular diastolic parameters are consistent with Grade I diastolic dysfunction (impaired relaxation). Right Ventricle: The right ventricular size is normal. No increase in right ventricular wall thickness. Right ventricular systolic function is normal. There is normal pulmonary artery systolic pressure. The tricuspid regurgitant velocity is 2.57 m/s, and  with an assumed right atrial pressure of 3 mmHg, the estimated right ventricular systolic pressure is 33.8 mmHg. Left Atrium: Left atrial size was normal in size. Right Atrium: Right atrial size was normal in size. Pericardium: There is no evidence of pericardial effusion. Presence of epicardial fat layer. Mitral Valve: MVA 3.42 cm2 by planimetry. The mitral valve is degenerative in appearance. Severe mitral annular calcification. No evidence of mitral valve regurgitation. Mild mitral valve stenosis. MV peak gradient, 6.8 mmHg. The mean mitral valve gradient is 3.0 mmHg. Tricuspid Valve: The tricuspid valve is normal in structure. Tricuspid valve regurgitation is not demonstrated. No evidence of tricuspid stenosis. Aortic Valve: The aortic valve was not well visualized. Aortic valve regurgitation is not visualized. Aortic valve sclerosis is present, with no evidence of aortic valve stenosis. Aortic valve mean gradient measures 6.0 mmHg. Aortic valve peak gradient measures 11.7 mmHg. Aortic valve area, by VTI measures 2.42  cm. Pulmonic Valve: The pulmonic valve was normal in structure. Pulmonic valve regurgitation is not visualized. No evidence of pulmonic stenosis. Aorta: The aortic root and ascending aorta are structurally normal, with no evidence of dilitation. IAS/Shunts: No atrial level shunt detected by color flow Doppler.  LEFT VENTRICLE PLAX 2D LVIDd:         5.00 cm   Diastology LVIDs:         2.80 cm   LV e' medial:    8.16 cm/s LV PW:         1.30 cm   LV E/e' medial:  13.5 LV IVS:        1.30 cm   LV e' lateral:   8.22 cm/s LVOT diam:     2.00 cm   LV E/e' lateral: 13.4 LV SV:         105 LV SV Index:   45 LVOT Area:     3.14 cm  RIGHT VENTRICLE RV Basal diam:  3.45 cm RV Mid diam:    2.70 cm RV S prime:     14.90 cm/s TAPSE (M-mode): 2.3 cm LEFT ATRIUM             Index        RIGHT ATRIUM           Index LA diam:        4.40 cm 1.88 cm/m   RA Area:     18.10 cm LA Vol (A2C):   58.9 ml 25.19 ml/m  RA Volume:   48.30 ml  20.66 ml/m LA Vol (A4C):   80.8 ml 34.56 ml/m LA Biplane Vol: 71.3 ml 30.50 ml/m  AORTIC VALVE                     PULMONIC VALVE AV Area (Vmax):    2.26 cm      PV Vmax:       1.06 m/s AV Area (Vmean):   2.38 cm      PV Peak grad:  4.5 mmHg AV Area (VTI):     2.42 cm AV Vmax:  171.00 cm/s AV Vmean:          118.000 cm/s AV VTI:            0.433 m AV Peak Grad:      11.7 mmHg AV Mean Grad:      6.0 mmHg LVOT Vmax:         123.00 cm/s LVOT Vmean:        89.500 cm/s LVOT VTI:          0.334 m LVOT/AV VTI ratio: 0.77  AORTA Ao Root diam: 3.30 cm Ao Asc diam:  3.50 cm MITRAL VALVE                TRICUSPID VALVE MV Area (PHT): 3.34 cm     TR Peak grad:   26.4 mmHg MV Area VTI:   2.60 cm     TR Vmax:        257.00 cm/s MV Peak grad:  6.8 mmHg MV Mean grad:  3.0 mmHg     SHUNTS MV Vmax:       1.30 m/s     Systemic VTI:  0.33 m MV Vmean:      71.4 cm/s    Systemic Diam: 2.00 cm MV Decel Time: 227 msec MV E velocity: 110.00 cm/s MV A velocity: 114.00 cm/s MV E/A ratio:  0.96 Rudean Haskell MD Electronically signed by Rudean Haskell MD Signature Date/Time: 09/23/2022/12:55:41 PM    Final    CT ANGIO HEAD NECK W WO CM W PERF (CODE STROKE)  Result Date: 09/23/2022 CLINICAL DATA:  76 year old male code stroke presentation. Left leg weakness and questionable hyperdenseright MCA on plain head CT. EXAM: CT ANGIOGRAPHY HEAD AND NECK CT PERFUSION BRAIN TECHNIQUE: Multidetector CT imaging of the head and neck was performed using the standard protocol during bolus administration of intravenous contrast. Multiplanar CT image reconstructions and MIPs were obtained to evaluate the vascular anatomy. Carotid stenosis measurements (when applicable) are obtained utilizing NASCET criteria, using the distal internal carotid diameter as the denominator. Multiphase CT imaging of the brain was performed following IV bolus contrast injection. Subsequent parametric perfusion maps were calculated using RAPID software. RADIATION DOSE REDUCTION: This exam was performed according to the departmental dose-optimization program which includes automated exposure control, adjustment of the mA and/or kV according to patient size and/or use of iterative reconstruction technique. CONTRAST:  144m OMNIPAQUE IOHEXOL 350 MG/ML SOLN COMPARISON:  Plain head CT 0857 hours today. FINDINGS: CT Brain Perfusion Findings: ASPECTS: Ten CBF (<30%) Volume: 08mPerfusion (Tmax>6.0s) volume: 31m65mismatch Volume: Not applicable Infarction Location:Not applicable CTA NECK Skeleton: Widespread cervical spine degeneration. Chronic appearing mild T3 compression fracture. No acute osseous abnormality identified. Upper chest: Mild atelectasis. Negative visible superior mediastinum. Other neck: Glottis is closed. No acute soft tissue finding identified in the neck. Aortic arch: Calcified aortic atherosclerosis. 3 vessel arch configuration. Right carotid system: Tortuous brachiocephalic artery without stenosis. Calcified plaque at the  right CCA origin but no stenosis. Additional calcified plaque approaching the right carotid bifurcation and moderate calcified plaque at the right ICA medial origin and posterior bulb. Up to 50 % stenosis with respect to the distal vessel. The right ICA remains patent to the skull base. Left carotid system: Mild tortuosity. Soft and calcified plaque in the anterior left CCA before the bifurcation without stenosis. Mild calcified plaque mostly at the left ICA bulb without stenosis. Vertebral arteries: Mild calcified plaque at the right subclavian artery origin without stenosis. Normal right vertebral artery origin.  Late entry of the right vertebral artery into the cervical transverse foramen, but otherwise patent and normal to the skull base. Mild tortuosity and calcified plaque in the proximal left subclavian artery without stenosis. Normal left vertebral artery origin. Codominant left vertebral artery appears patent and normal to the skull base. CTA HEAD Posterior circulation: Normal distal vertebral arteries and vertebrobasilar junction. Diminutive or absent PICA. Patent basilar artery to the tip without stenosis. Patent SCA and PCA origins. Posterior communicating arteries are diminutive or absent. Bilateral PCA branches are within normal limits. Anterior circulation: Both ICA siphons are patent. Mild siphon ectasia on the left. Mild left and mild to moderate right ICA siphon calcified plaque but no hemodynamically significant siphon stenosis. Patent carotid termini, MCA and ACA origins. Diminutive or absent anterior communicating artery. Bilateral ACA branches are within normal limits. Left MCA M1 segment bifurcates early without stenosis. Right MCA M1 segment and right MCA trifurcation are patent without stenosis. Bilateral MCA branches are within normal limits. Venous sinuses: Patent. Anatomic variants: None significant. Review of the MIP images confirms the above findings Preliminary report discussed by  telephone with Neurology Dr. Quinn Axe on 09/23/2022 at 0914 hours. IMPRESSION: 1. Negative for large vessel occlusion.  Negative CT Perfusion. 2. Bilateral cervical carotid and ICA siphon mostly calcified atherosclerosis. But no hemodynamically significant stenosis. 3. Vertebral arteries and posterior circulation appear negative. 4. Chronic appearing mild T3 compression fracture. 5. Aortic Atherosclerosis (ICD10-I70.0). Electronically Signed   By: Genevie Ann M.D.   On: 09/23/2022 09:21   CT HEAD CODE STROKE WO CONTRAST  Result Date: 09/23/2022 CLINICAL DATA:  Code stroke. 76 year old male. Headache, vertigo, left lower extremity weakness. EXAM: CT HEAD WITHOUT CONTRAST TECHNIQUE: Contiguous axial images were obtained from the base of the skull through the vertex without intravenous contrast. RADIATION DOSE REDUCTION: This exam was performed according to the departmental dose-optimization program which includes automated exposure control, adjustment of the mA and/or kV according to patient size and/or use of iterative reconstruction technique. COMPARISON:  None Available. FINDINGS: Brain: Cerebral volume is within normal limits for age. No midline shift, ventriculomegaly, mass effect, evidence of mass lesion, intracranial hemorrhage or evidence of cortically based acute infarction. Gray-white matter differentiation is within normal limits for age. Vascular: Calcified atherosclerosis at the skull base. However a, there is a questionable hyperdense right MCA on sagittal images 21 and 22, but also streak artifact here. Skull: Negative. Sinuses/Orbits: Visualized paranasal sinuses and mastoids are clear. Tympanic cavities appear clear. Other: Visualized orbits and scalp soft tissues are within normal limits. ASPECTS [facility] Stroke Program Early CT Score) Total score (0-10 with 10 being normal): 10 IMPRESSION: Questionable hyperdense Right MCA. But otherwise normal for age noncontrast CT appearance of the brain. ASPECTS  is 10. Study discussed by telephone with Dr. Octaviano Glow on 09/23/2022 at 09:01. CTA is pending. Electronically Signed   By: Genevie Ann M.D.   On: 09/23/2022 09:03    Microbiology: Results for orders placed or performed during the hospital encounter of 04/02/21  Resp Panel by RT-PCR (Flu A&B, Covid) Nasopharyngeal Swab     Status: None   Collection Time: 04/02/21  2:05 AM   Specimen: Nasopharyngeal Swab; Nasopharyngeal(NP) swabs in vial transport medium  Result Value Ref Range Status   SARS Coronavirus 2 by RT PCR NEGATIVE NEGATIVE Final    Comment: (NOTE) SARS-CoV-2 target nucleic acids are NOT DETECTED.  The SARS-CoV-2 RNA is generally detectable in upper respiratory specimens during the acute phase of infection. The lowest concentration  of SARS-CoV-2 viral copies this assay can detect is 138 copies/mL. A negative result does not preclude SARS-Cov-2 infection and should not be used as the sole basis for treatment or other patient management decisions. A negative result may occur with  improper specimen collection/handling, submission of specimen other than nasopharyngeal swab, presence of viral mutation(s) within the areas targeted by this assay, and inadequate number of viral copies(<138 copies/mL). A negative result must be combined with clinical observations, patient history, and epidemiological information. The expected result is Negative.  Fact Sheet for Patients:  EntrepreneurPulse.com.au  Fact Sheet for Healthcare Providers:  IncredibleEmployment.be  This test is no t yet approved or cleared by the [place] FDA and  has been authorized for detection and/or diagnosis of SARS-CoV-2 by FDA under an Emergency Use Authorization (EUA). This EUA will remain  in effect (meaning this test can be used) for the duration of the COVID-19 declaration under Section 564(b)(1) of the Act, 21 U.S.C.section 360bbb-3(b)(1), unless the authorization is  terminated  or revoked sooner.       Influenza A by PCR NEGATIVE NEGATIVE Final   Influenza B by PCR NEGATIVE NEGATIVE Final    Comment: (NOTE) The Xpert Xpress SARS-CoV-2/FLU/RSV plus assay is intended as an aid in the diagnosis of influenza from Nasopharyngeal swab specimens and should not be used as a sole basis for treatment. Nasal washings and aspirates are unacceptable for Xpert Xpress SARS-CoV-2/FLU/RSV testing.  Fact Sheet for Patients: EntrepreneurPulse.com.au  Fact Sheet for Healthcare Providers: IncredibleEmployment.be  This test is not yet approved or cleared by the [place] FDA and has been authorized for detection and/or diagnosis of SARS-CoV-2 by FDA under an Emergency Use Authorization (EUA). This EUA will remain in effect (meaning this test can be used) for the duration of the COVID-19 declaration under Section 564(b)(1) of the Act, 21 U.S.C. section 360bbb-3(b)(1), unless the authorization is terminated or revoked.  Performed at [facility], [address] [place], [place] [address]     Labs: CBC: Recent Labs  Lab 09/23/22 0830 09/23/22 0848 09/24/22 0549  WBC 8.4  --  6.7  NEUTROABS 6.8  --   --   HGB 11.9* 12.9* 10.9*  HCT 36.8* 38.0* 34.4*  MCV 91.1  --  93.7  PLT 262  --  176   Basic Metabolic Panel: Recent Labs  Lab 09/23/22 0830 09/23/22 0848 09/24/22 0549  NA 136 136 140  K 4.6 4.5 4.5  CL 101 100 106  CO2 25  --  28  GLUCOSE 188* 186* 113*  BUN 29* 27* 24*  CREATININE 2.23* 2.40* 2.03*  CALCIUM 9.3  --  8.9  MG 1.9  --   --   PHOS 2.7  --   --    Liver Function Tests: Recent Labs  Lab 09/23/22 0830  AST 29  ALT 49*  ALKPHOS 64  BILITOT 1.0  PROT 7.4  ALBUMIN 3.6   CBG: Recent Labs  Lab 09/23/22 0815 09/23/22 1126 09/23/22 1630 09/23/22 2106 09/24/22 0833  GLUCAP 161* 176* 88 84 148*    Discharge time spent: greater than 45  minutes.  Signed: Deatra James,  MD [facility] 09/24/2022

## 2022-09-24 NOTE — TOC Transition Note (Addendum)
Transition of Care [facility]) - CM/SW Discharge Note   Patient Details  Name: Rick [name] MRN: [id number] Date of Birth: [birth year]  Transition of Care [facility] CM/SW Contact:  Shade Flood, LCSW Phone Number: 09/24/2022, 2:18 PM   Clinical Narrative:      Pt discharged home earlier today. PT had recommended HH PT. Contacted pt at home via phone to update on PT recommendations. Pt agreeable to [facility] follow up. CMS provider options reviewed. Referred to [facility] and they will see pt tomorrow. MD placed orders.  No other TOC needs.  1438: Received call back from [place] at [facility] stating that they are not in network with pt's insurance and so cannot see him after all. Spoke with pt who was agreeable to a referral to [facility] which is the only in network provider of [place] with pt's insurance. Referral made to [facility] and awaiting return call to see if they can meet pt's needs. Pt aware that TOC will follow up once update received.  Expected Discharge Plan: [place] Barriers to Discharge: Barriers Resolved   Patient Goals and CMS Choice Patient states their goals for this hospitalization and ongoing recovery are:: go home CMS Medicare.gov Compare Post Acute Care list provided to:: Patient Choice offered to / list presented to : Patient  Expected Discharge Plan and Services Expected Discharge Plan: [place] In-house Referral: Clinical Social Work   Post Acute Care Choice: [place] arrangements for the past 2 months: [place] Expected Discharge Date: 09/24/22                         HH Arranged: RN, PT [place] Agency: [place] ([place]) Date [place]: 09/24/22   Representative spoke with at [place]: Vaughan Basta  Prior Living Arrangements/Services Living arrangements for the past 2 months: [place] Lives with:: Spouse Patient language and need for interpreter reviewed:: Yes Do you feel safe  going back to the place where you live?: Yes      Need for Family Participation in Patient Care: No (Comment) Care giver support system in place?: Yes (comment)   Criminal Activity/Legal Involvement Pertinent to Current Situation/Hospitalization: No - Comment as needed  Activities of Daily Living Home Assistive Devices/Equipment: None ADL Screening (condition at time of admission) Patient's cognitive ability adequate to safely complete daily activities?: Yes Is the patient deaf or have difficulty hearing?: No Does the patient have difficulty seeing, even when wearing glasses/contacts?: No Does the patient have difficulty concentrating, remembering, or making decisions?: No Patient able to express need for assistance with ADLs?: Yes Does the patient have difficulty dressing or bathing?: No Independently performs ADLs?: Yes (appropriate for developmental age) Does the patient have difficulty walking or climbing stairs?: No Weakness of Legs: None Weakness of Arms/Hands: None  Permission Sought/Granted Permission sought to share information with : Facility Art therapist granted to share information with : Yes, Verbal Permission Granted     Permission granted to share info w AGENCY: HH        Emotional Assessment   Attitude/Demeanor/Rapport: Engaged Affect (typically observed): Pleasant Orientation: : Oriented to Self, Oriented to Place, Oriented to  Time, Oriented to Situation Alcohol / Substance Use: Not Applicable Psych Involvement: No (comment)  Admission diagnosis:  Vertigo [R42] Stroke [facility]) [I63.9] Left leg weakness [R29.898] Patient Active Problem List   Diagnosis Date Noted   Stroke ([place]) 09/23/2022   DMII (diabetes  mellitus, type 2) ([place]) 09/23/2022    Class: Chronic   Vertigo 09/23/2022    Class: Acute   Essential (primary) hypertension    Pure hypercholesterolemia, unspecified    Type 2 diabetes mellitus without complications ([place])     Atherosclerosis of aorta (HCC)    Chronic kidney disease, stage 4 (severe) (HCC)    Chronic obstructive pulmonary disease, unspecified (HCC)    Hypertensive chronic kidney disease with stage 1 through stage 4 chronic kidney disease, or unspecified chronic kidney disease    Male erectile disorder    Obesity, unspecified    Type 2 diabetes mellitus with diabetic nephropathy ([place])    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   PCP:  Asencion Noble, MD Pharmacy:   [id number] [id number]: [phone]: [phone]    Social Determinants of Health (SDOH) Interventions    Readmission Risk Interventions     No data to display           Final next level of care: H[place]Barriers to Discharge: Barriers Resolved   Patient Goals and CMS Choice Patient states their goals for this hospitalization and ongoing recovery are:: go home CMS Medicare.gov Compare Post Acute Care list provided to:: Patient Choice offered to / list presented to : Patient  Discharge Placement                       Discharge Plan and Services In-house Referral: Clinical Social Work   Post Acute Care Choice: Home Health                    [facility]: RN, [facility] [place] Date [place] 09/24/22   Representative spoke with at [place] Interventions     Readmission Risk Interventions     No data to display

## 2022-09-24 NOTE — Plan of Care (Signed)
  Problem: Metabolic: Goal: Ability to maintain appropriate glucose levels will improve Outcome: Not Progressing   Problem: Tissue Perfusion: Goal: Adequacy of tissue perfusion will improve Outcome: Not Progressing   Problem: Clinical Measurements: Goal: Diagnostic test results will improve Outcome: Not Progressing   Problem: Safety: Goal: Ability to remain free from injury will improve Outcome: Not Progressing

## 2022-09-24 NOTE — Evaluation (Signed)
Physical Therapy Evaluation Patient Details Name: Rick [name] MRN: [id number] DOB: [birth year] Today's Date: 09/24/2022  History of Present Illness  76 y.o. M admitted on 09/23/22 due to symptoms of Vertigo. Pt was admitted as a stroke work up, however CT's are negative. PMH significant for atherosclerosis of aorta, DM 2, HTN, HLD, CKD stage III, benign kidney tumor, lateral ventral hernia low back pain.   Clinical Impression  Patient seen for Hallpike-Dix maneuver and negative for left and right sides, positive for up beating nystagmus during supine to sitting with mild c/o dizziness that lasted up to 2-3 minutes while seated at EOB.  Patient instructed  to sit up slowly at home and wait to symptoms resolve before attempting functional activity and to try head turning habituating exercises to possibly reduce sensitivity to movement.  Patient demonstrates good return for functional activities and walking in room/hallway without loss of balance.  Plan:  Patient discharged from physical therapy to care of nursing for ambulation daily as tolerated for length of stay with recommendation below.   Recommendations for follow up therapy are one component of a multi-disciplinary discharge planning process, led by the attending physician.  Recommendations may be updated based on patient status, additional functional criteria and insurance authorization.  Follow Up Recommendations Outpatient PT (vestibular rehab)      Assistance Recommended at Discharge PRN  Patient can return home with the following  Help with stairs or ramp for entrance    Equipment Recommendations None recommended by PT  Recommendations for Other Services       Functional Status Assessment Patient has had a recent decline in their functional status and demonstrates the ability to make significant improvements in function in a reasonable and predictable amount of time.     Precautions / Restrictions Precautions Precautions:  None Restrictions Weight Bearing Restrictions: No      Mobility  Bed Mobility Overal bed mobility: Modified Independent                  Transfers Overall transfer level: Modified independent Equipment used: None                    Ambulation/Gait Ambulation/Gait assistance: Modified independent (Device/Increase time) Gait Distance (Feet): 120 Feet Assistive device: None Gait Pattern/deviations: WFL(Within Functional Limits) Gait velocity: decreased     General Gait Details: grossly WFL with good return for ambulation in room and hallway without loss of balance  Stairs            Wheelchair Mobility    Modified Rankin (Stroke Patients Only)       Balance Overall balance assessment: No apparent balance deficits (not formally assessed)                                           Pertinent Vitals/Pain Pain Assessment Pain Assessment: No/denies pain    Home Living Family/patient expects to be discharged to:: Private residence Living Arrangements: Spouse/significant other Available Help at Discharge: Family;Available 24 hours/day Type of Home: House Home Access: Stairs to enter Entrance Stairs-Rails: None Entrance Stairs-Number of Steps: 1 step   Home Layout: One level Home Equipment: Cane - quad;Cane - single point;Crutches;BSC/3in1      Prior Function Prior Level of Function : Independent/Modified Independent;Driving             Mobility Comments: Community ambulator without AD, drives ADLs  Comments: Independent     Hand Dominance   Dominant Hand: Right    Extremity/Trunk Assessment   Upper Extremity Assessment Upper Extremity Assessment: Defer to OT evaluation    Lower Extremity Assessment Lower Extremity Assessment: Overall WFL for tasks assessed    Cervical / Trunk Assessment Cervical / Trunk Assessment: Normal  Communication   Communication: No difficulties  Cognition Arousal/Alertness:  Awake/alert Behavior During Therapy: WFL for tasks assessed/performed Overall Cognitive Status: Within Functional Limits for tasks assessed                                          General Comments General comments (skin integrity, edema, etc.): VSS on RA, daughter present and supportive.    Exercises     Assessment/Plan    PT Assessment All further PT needs can be met in the next venue of care  PT Problem List Decreased balance;Decreased activity tolerance;Other (comment) (c/o of vertigo and dizziness)       PT Treatment Interventions      PT Goals (Current goals can be found in the Care Plan section)  Acute Rehab PT Goals Patient Stated Goal: return home with family to assist PT Goal Formulation: With patient/family Time For Goal Achievement: 09/24/22 Potential to Achieve Goals: Good    Frequency       Co-evaluation PT/OT/SLP Co-Evaluation/Treatment: Yes Reason for Co-Treatment: To address functional/ADL transfers;Complexity of the patient's impairments (multi-system involvement) PT goals addressed during session: Mobility/safety with mobility;Balance;Other (comment) (asses for posible BPPV)         AM-PAC PT "6 Clicks" Mobility  Outcome Measure Help needed turning from your back to your side while in a flat bed without using bedrails?: None Help needed moving from lying on your back to sitting on the side of a flat bed without using bedrails?: None Help needed moving to and from a bed to a chair (including a wheelchair)?: None Help needed standing up from a chair using your arms (e.g., wheelchair or bedside chair)?: None Help needed to walk in hospital room?: None Help needed climbing 3-5 steps with a railing? : A Little 6 Click Score: 23    End of Session   Activity Tolerance: Patient tolerated treatment well;Other (comment) (limited by mild dizziness and vertigo) Patient left: in bed;with call bell/phone within reach;with family/visitor  present Nurse Communication: Mobility status PT Visit Diagnosis: Difficulty in walking, not elsewhere classified (R26.2);Other abnormalities of gait and mobility (R26.89);Unsteadiness on feet (R26.81)    Time: 2956-2130 PT Time Calculation (min) (ACUTE ONLY): 23 min   Charges:   PT Evaluation $PT Eval Moderate Complexity: 1 Mod PT Treatments $Therapeutic Activity: 23-37 mins        12:37 PM, 09/24/22 Lonell Grandchild, MPT Physical Therapist with [facility] [phone] office [phone] mobile phone

## 2022-09-24 NOTE — Assessment & Plan Note (Signed)
Lab Results  Component Value Date   CREATININE 2.03 (H) 09/24/2022   CREATININE 2.40 (H) 09/23/2022   CREATININE 2.23 (H) 09/23/2022  Avoid nephrotoxins -We have discontinued HCTZ lisinopril at this point

## 2022-09-24 NOTE — Progress Notes (Signed)
Clarified with Dr. Orlin Hilding regarding IV fluid of patient since there are 2 active orders where the recent one  is to run at 150 mls/ hr and will stop at 0300 while the other will is continuous at 125 mls/ hr, per dr. Orlin Hilding, may hold the 125 mls/ hr and follow the 150 mls order as patient is tolerating oral drinks.

## 2022-09-25 ENCOUNTER — Encounter: Payer: Self-pay | Admitting: Neurology

## 2022-09-25 NOTE — TOC Progression Note (Signed)
Transition of Care [facility]) - Progression Note    Patient Details  Name: Rick [name] MRN: [id number] Date of Birth: 1946-11-09  Transition of Care [facility]) CM/SW Contact  Shade Flood, LCSW Phone Number: 09/25/2022, 9:20 AM  Clinical Narrative:     At the end of the day yesterday, TOC received call from [facility] at [facility] stating that they would see pt for services under their charity care program. Updated pt who was in agreement. HHRN to start care today. TOC also called [facility] in [place] to make referral for neuro follow up at pt request. They will reach out to pt to schedule.   Expected Discharge Plan: [place] Barriers to Discharge: Barriers Resolved  Expected Discharge Plan and Services Expected Discharge Plan: [place] In-house Referral: Clinical Social Work   Post Acute Care Choice: [place] arrangements for the past 2 months: [place] Expected Discharge Date: 09/24/22                         HH Arranged: RN, PT [place] Agency: [place] ([place]) Date [place]: 09/24/22   Representative spoke with at [place]: [place] ([place]) Interventions    Readmission Risk Interventions     No data to display

## 2022-11-14 ENCOUNTER — Other Ambulatory Visit: Payer: Self-pay | Admitting: Internal Medicine

## 2022-11-14 DIAGNOSIS — Z122 Encounter for screening for malignant neoplasm of respiratory organs: Secondary | ICD-10-CM

## 2022-11-26 ENCOUNTER — Ambulatory Visit: Payer: Medicare Other | Admitting: Neurology

## 2023-03-13 ENCOUNTER — Ambulatory Visit: Payer: Medicare Other | Admitting: Cardiology

## 2023-05-29 ENCOUNTER — Other Ambulatory Visit (HOSPITAL_COMMUNITY): Payer: Self-pay | Admitting: Internal Medicine

## 2023-05-29 DIAGNOSIS — R911 Solitary pulmonary nodule: Secondary | ICD-10-CM

## 2023-07-10 ENCOUNTER — Ambulatory Visit (HOSPITAL_COMMUNITY)
Admission: RE | Admit: 2023-07-10 | Discharge: 2023-07-10 | Disposition: A | Discharge status: Home / Self Care | Payer: Medicare Other | Source: Ambulatory Visit | Attending: Internal Medicine | Admitting: Internal Medicine

## 2023-07-10 DIAGNOSIS — R911 Solitary pulmonary nodule: Secondary | ICD-10-CM | POA: Diagnosis present

## 2023-07-17 NOTE — Progress Notes (Signed)
    Cardiology Office Note  Date: 07/18/2023   ID: Rick [name], DOB Sep 19, 1946, MRN [id number]  History of Present Illness: Rick [name] is a 77 y.o. male last seen in August 2022.  He is here for a follow-up visit.  Reports no interval chest pain with exertion, no palpitations or syncope.  He continues to follow with Dr. Ouida Sills for primary care.  I reviewed his medications.  He remains on Cardizem CD and Lipitor.  LDL was 79 in February.  He had a recent screening chest CT showing stable small pulmonary nodules, no progressive findings, aortic atherosclerosis as noted previously.  ECG today shows normal sinus rhythm.  Physical Exam: VS:  BP 134/80   Pulse 72   Ht 5\' 11"  (1.803 m)   Wt 255 lb (115.7 kg)   SpO2 96%   BMI 35.57 kg/m , BMI Body mass index is 35.57 kg/m.  Wt Readings from Last 3 Encounters:  07/18/23 255 lb (115.7 kg)  09/24/22 253 lb 12 oz (115.1 kg)  07/13/21 255 lb (115.7 kg)    General: Patient appears comfortable at rest. HEENT: Conjunctiva and lids normal. Neck: Supple, no elevated JVP or carotid bruits. Lungs: Clear to auscultation, nonlabored breathing at rest. Cardiac: Regular rate and rhythm, no S3 or significant systolic murmur. Extremities: No pitting edema.  ECG:  An ECG dated 09/23/2022 was personally reviewed today and demonstrated:  Sinus rhythm.  Labwork: 09/23/2022: ALT 49; AST 29; Magnesium 1.9; TSH 2.331 09/24/2022: BUN 24; Creatinine, Ser 2.03; Hemoglobin 10.9; Platelets 218; Potassium 4.5; Sodium 140     Component Value Date/Time   CHOL 149 09/23/2022 0830   TRIG 53 09/23/2022 0830   HDL 50 09/23/2022 0830   CHOLHDL 3.0 09/23/2022 0830   VLDL 11 09/23/2022 0830   LDLCALC 88 09/23/2022 0830  February 2024: Cholesterol 144, triglycerides 72, HDL 51, LDL 79 May 2024: BUN 28, creatinine 1.92, potassium 4.8, GFR 36, hemoglobin A1c 6.5%, hemoglobin 12.2, platelets 243  Other Studies Reviewed Today:  Echocardiogram 09/23/2022:  1.  Left ventricular ejection fraction, by estimation, is 65 to 70%. The  left ventricle has normal function. The left ventricle has no regional  wall motion abnormalities. There is mild concentric left ventricular  hypertrophy. Left ventricular diastolic  parameters are consistent with Grade I diastolic dysfunction (impaired  relaxation).   2. Right ventricular systolic function is normal. The right ventricular  size is normal. There is normal pulmonary artery systolic pressure.   3. The mitral valve is degenerative. No evidence of mitral valve  regurgitation. Mild mitral stenosis. The mean mitral valve gradient is 3.0  mmHg. Severe mitral annular calcification.   4. The aortic valve was not well visualized. Aortic valve regurgitation  is not visualized. Aortic valve sclerosis is present, with no evidence of  aortic valve stenosis.   Assessment and Plan:  1.  History of paroxysmal atrial tachycardia as well as frequent PACs.  He remains symptomatically stable without active palpitations on Cardizem CD.  ECG today is normal.  Continue observation.  2.  Aortic atherosclerosis and coronary artery calcification evident by CT imaging.  LDL 79 in February.  Continue Lipitor.  3.  Essential hypertension.  Blood pressure is reasonable today, no changes made.  Keep follow-up with Dr. Ouida Sills.  4.  CKD stage IIIb.  Most recent creatinine 1.92.  Disposition:  Follow up  1 year.  Signed, Jonelle Sidle, M.D., F.A.C.C. [facility] at [facility]

## 2023-07-18 ENCOUNTER — Ambulatory Visit: Payer: Medicare Other | Attending: Cardiology | Admitting: Cardiology

## 2023-07-18 ENCOUNTER — Encounter: Payer: Self-pay | Admitting: Cardiology

## 2023-07-18 VITALS — BP 134/80 | HR 72 | Ht 71.0 in | Wt 255.0 lb

## 2023-07-18 DIAGNOSIS — I700 Atherosclerosis of aorta: Secondary | ICD-10-CM | POA: Diagnosis not present

## 2023-07-18 DIAGNOSIS — N1832 Chronic kidney disease, stage 3b: Secondary | ICD-10-CM

## 2023-07-18 DIAGNOSIS — I10 Essential (primary) hypertension: Secondary | ICD-10-CM

## 2023-07-18 DIAGNOSIS — I4719 Other supraventricular tachycardia: Secondary | ICD-10-CM

## 2023-07-18 NOTE — Patient Instructions (Signed)
Medication Instructions:  Your physician recommends that you continue on your current medications as directed. Please refer to the Current Medication list given to you today.   Labwork: None today  Testing/Procedures: None today  Follow-Up: 1 year Dr.McDowell  Any Other Special Instructions Will Be Listed Below (If Applicable).  If you need a refill on your cardiac medications before your next appointment, please call your pharmacy.

## 2024-07-31 ENCOUNTER — Ambulatory Visit: Attending: Cardiology | Admitting: Cardiology

## 2024-07-31 ENCOUNTER — Encounter: Payer: Self-pay | Admitting: Cardiology

## 2024-07-31 VITALS — BP 134/80 | HR 83 | Ht 71.0 in | Wt 258.2 lb

## 2024-07-31 DIAGNOSIS — I10 Essential (primary) hypertension: Secondary | ICD-10-CM

## 2024-07-31 DIAGNOSIS — I700 Atherosclerosis of aorta: Secondary | ICD-10-CM | POA: Diagnosis not present

## 2024-07-31 DIAGNOSIS — I4719 Other supraventricular tachycardia: Secondary | ICD-10-CM

## 2024-07-31 DIAGNOSIS — N1832 Chronic kidney disease, stage 3b: Secondary | ICD-10-CM | POA: Diagnosis not present

## 2024-07-31 NOTE — Patient Instructions (Signed)
 Medication Instructions:  Your physician recommends that you continue on your current medications as directed. Please refer to the Current Medication list given to you today.   Labwork: None today  Testing/Procedures: None today  Follow-Up: 1 year  Any Other Special Instructions Will Be Listed Below (If Applicable).  If you need a refill on your cardiac medications before your next appointment, please call your pharmacy.

## 2024-07-31 NOTE — Progress Notes (Signed)
    Cardiology Office Note  Date: 07/31/2024   ID: Rick [name], Rick [name] 01-22-1946, MRN [id number]  History of Present Illness: Rick [name] is a 78 y.o. male last seen in August 2024.  He is here for a routine visit.  He does not report any significant palpitations, no dizziness or syncope.  No exertional chest pain.  His main functional limitations are orthopedic in nature.  He continues to follow with Dr. Sheryle.  We went over his medications.  He has done well on Cardizem  CD 240 mg daily.  I reviewed his interval lab work.  I reviewed his ECG today which shows normal sinus rhythm.  Physical Exam: VS:  BP 134/80 (BP Location: Left Arm, Cuff Size: Normal)   Pulse 83   Ht 5' 11 (1.803 m)   Wt 258 lb 3.2 oz (117.1 kg)   SpO2 94%   BMI 36.01 kg/m , BMI Body mass index is 36.01 kg/m.  Wt Readings from Last 3 Encounters:  07/31/24 258 lb 3.2 oz (117.1 kg)  07/18/23 255 lb (115.7 kg)  09/24/22 253 lb 12 oz (115.1 kg)    General: Patient appears comfortable at rest. HEENT: Conjunctiva and lids normal. Neck: Supple, no elevated JVP or carotid bruits. Lungs: Clear to auscultation, nonlabored breathing at rest. Cardiac: Regular rate and rhythm, no S3 or significant systolic murmur. Extremities: No pitting edema.  ECG:  An ECG dated 07/18/2023 was personally reviewed today and demonstrated:  Sinus rhythm.  Labwork:  May 2025: BUN 29, creatinine 2.2, GFR 30, potassium 5.3, hemoglobin A1c 6.9%, magnesium  2.0  Other Studies Reviewed Today:  No interval cardiac testing for review today.  Assessment and Plan:  1.  History of paroxysmal atrial tachycardia as well as frequent PACs.  Asymptomatic at this time on Cardizem  CD 240 mg daily.  ECG is normal today.  Continue with current plan.   2.  Aortic atherosclerosis and coronary artery calcification evident by CT imaging.  LDL 94 in February.  Continue Lipitor 40 mg daily.   3.  Primary hypertension.  No change in current regimen,  he is on Zestoretic 20/12.5 mg daily.   4.  CKD stage IIIb.  Creatinine 2.2 with GFR 30.  Keep follow-up with Dr. Sheryle.  Disposition:  Follow up 1 year.  Signed, Jayson JUDITHANN Sierras, M.D., F.A.C.C. [facility] at [facility]

## 2024-11-23 ENCOUNTER — Other Ambulatory Visit (HOSPITAL_COMMUNITY): Payer: Self-pay | Admitting: Internal Medicine

## 2024-11-23 DIAGNOSIS — R918 Other nonspecific abnormal finding of lung field: Secondary | ICD-10-CM

## 2024-12-04 ENCOUNTER — Ambulatory Visit (HOSPITAL_COMMUNITY)
Admission: RE | Admit: 2024-12-04 | Discharge: 2024-12-04 | Disposition: A | Discharge status: Home / Self Care | Source: Ambulatory Visit | Attending: Internal Medicine | Admitting: Internal Medicine

## 2024-12-04 DIAGNOSIS — R918 Other nonspecific abnormal finding of lung field: Secondary | ICD-10-CM | POA: Insufficient documentation

## 2024-12-23 ENCOUNTER — Telehealth (HOSPITAL_BASED_OUTPATIENT_CLINIC_OR_DEPARTMENT_OTHER): Payer: Self-pay

## 2024-12-23 NOTE — Telephone Encounter (Signed)
"  ° °  Pre-operative Risk Assessment    Patient Name: Rick [name]  DOB: October 22, 1946 MRN: [id number]   Date of last office visit: 07/31/24 with Dr. Debera Date of next office visit: NA  Request for Surgical Clearance    Procedure:  Right Total Knee Arthroplasty   Date of Surgery:  Clearance     02/15/25                             Surgeon:  Dr. Melodi Surgeon's Group or Practice Name:  [facility] Phone number:  [phone] Fax number:  [phone]   Type of Clearance Requested:   - Medical    Type of Anesthesia:  choice   Additional requests/questions:    SignedAugustin JONETTA Daring   12/23/2024, 3:28 PM   "

## 2024-12-23 NOTE — Telephone Encounter (Addendum)
" ° °  Name: Rick [name]  DOB: 1946/02/18  MRN: [id number]  Primary Cardiologist: Jayson Sierras, MD  Chart reviewed as part of pre-operative protocol coverage. Patient lives in [place] ; therefore, unable to perform virtual visit. He will require a follow-up in-office visit in order to better assess preoperative cardiovascular risk.  Pre-op covering staff: - Please schedule appointment and call patient to inform them. If patient already had an upcoming appointment within acceptable timeframe, please add pre-op clearance to the appointment notes so provider is aware. - Please contact requesting surgeon's office via preferred method (i.e, phone, fax) to inform them of need for appointment prior to surgery.   Rick Munford E Alinda Egolf, PA-C  12/23/2024, 3:35 PM   "

## 2024-12-23 NOTE — Telephone Encounter (Signed)
 Patient scheduled for pre-op clearance on 02/04/25 with Damien Braver, NP.

## 2025-02-04 ENCOUNTER — Ambulatory Visit: Admitting: Nurse Practitioner

## 2025-02-15 ENCOUNTER — Ambulatory Visit (HOSPITAL_COMMUNITY): Admit: 2025-02-15 | Admitting: Orthopedic Surgery

## 2025-02-15 SURGERY — ARTHROPLASTY, KNEE, TOTAL
Anesthesia: Choice | Site: Knee | Laterality: Right
